# Patient Record
Sex: Male | Born: 1950 | Race: White | Hispanic: No | Marital: Married | Smoking: Never smoker
Health system: Southern US, Community
[De-identification: ages and names within clinical notes are randomized; demographics above are authoritative.]

## PROBLEM LIST (undated history)

## (undated) DIAGNOSIS — J309 Allergic rhinitis, unspecified: Secondary | ICD-10-CM

## (undated) DIAGNOSIS — K219 Gastro-esophageal reflux disease without esophagitis: Secondary | ICD-10-CM

## (undated) DIAGNOSIS — L988 Other specified disorders of the skin and subcutaneous tissue: Secondary | ICD-10-CM

## (undated) DIAGNOSIS — C01 Malignant neoplasm of base of tongue: Secondary | ICD-10-CM

## (undated) HISTORY — PX: OTHER SURGICAL HISTORY: SHX169

## (undated) HISTORY — PX: COLONOSCOPY: SHX174

## (undated) HISTORY — DX: Other specified disorders of the skin and subcutaneous tissue: L98.8

## (undated) HISTORY — PX: TONSILLECTOMY: SUR1361

---

## 2014-01-13 DIAGNOSIS — C01 Malignant neoplasm of base of tongue: Secondary | ICD-10-CM

## 2014-01-13 HISTORY — DX: Malignant neoplasm of base of tongue: C01

## 2014-01-13 HISTORY — PX: OTHER SURGICAL HISTORY: SHX169

## 2014-01-20 ENCOUNTER — Telehealth: Payer: Self-pay | Admitting: *Deleted

## 2014-01-20 NOTE — Telephone Encounter (Signed)
Called Mr. Butt to offer him attendance at next week [facility] [place].  I explained the purpose and format of the clinic, he agreed to attend.  I confirmed his understanding of [place] location, provided him an arrival time of 12:30, explained the registration procedure.  Initiating navigation as L1 patient (new patient) with this encounter.  Gayleen Orem, RN, BSN, [place] [facility] [place] [phone]

## 2014-01-23 ENCOUNTER — Ambulatory Visit (HOSPITAL_COMMUNITY)
Admission: RE | Admit: 2014-01-23 | Discharge: 2014-01-23 | Disposition: A | Discharge status: Home / Self Care | Payer: BC Managed Care – PPO | Source: Ambulatory Visit | Attending: Otolaryngology | Admitting: Otolaryngology

## 2014-01-23 ENCOUNTER — Encounter (HOSPITAL_COMMUNITY): Payer: Self-pay

## 2014-01-23 ENCOUNTER — Other Ambulatory Visit (HOSPITAL_COMMUNITY): Payer: Self-pay | Admitting: Otolaryngology

## 2014-01-23 DIAGNOSIS — IMO0002 Reserved for concepts with insufficient information to code with codable children: Secondary | ICD-10-CM

## 2014-01-23 DIAGNOSIS — C01 Malignant neoplasm of base of tongue: Secondary | ICD-10-CM | POA: Diagnosis not present

## 2014-01-23 LAB — POCT I-STAT, CHEM 8
BUN: 10 mg/dL (ref 6–23)
Calcium, Ion: 1.18 mmol/L (ref 1.13–1.30)
Chloride: 101 mEq/L (ref 96–112)
Creatinine, Ser: 1 mg/dL (ref 0.50–1.35)
GLUCOSE: 100 mg/dL — AB (ref 70–99)
HEMATOCRIT: 47 % (ref 39.0–52.0)
HEMOGLOBIN: 16 g/dL (ref 13.0–17.0)
Potassium: 3.9 mEq/L (ref 3.7–5.3)
Sodium: 139 mEq/L (ref 137–147)
TCO2: 27 mmol/L (ref 0–100)

## 2014-01-23 MED ORDER — IOHEXOL 300 MG/ML  SOLN
100.0000 mL | Freq: Once | INTRAMUSCULAR | Status: AC | PRN
  Administered 2014-01-23: 100 mL via INTRAVENOUS

## 2014-01-24 ENCOUNTER — Encounter: Payer: Self-pay | Admitting: Radiation Oncology

## 2014-01-24 NOTE — Progress Notes (Signed)
Head and Neck Cancer Location of Tumor / Histology:   Patient presented on 05/10/13 to Dr. Melissa Montane with c/o mild, intermittent sore throat x "6 months or longer". Examination revealed "hypertrophic lymph tissue in the base of his tongue and a piece of exudate that was in the vallecula."  He was given Prilosec for suspected Reflux.  On return visit on 06/10/13 he reported resolution of "90% of his symptoms, thus he was continued on Nexium BID for "a couple of months".  He was seen on 01/13/14 with with pain aain and fiberoptic exam revealed "an area of the left base of tongue that was different than the right side and had a "smooth cyst in the area".  CT Scan 01/23/14: Left tongue base mass as above, consistent with provided history of squamous cell carcinoma. No clearly enlarged cervical lymph nodes, however a 6 mm left level II lymph node is asymmetric and indeterminate.    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 Drinker, ? Drug use  Past/Anticipated interventions by otolaryngology, if any: Dr. Melissa Montane: Biopsy of Left Base of Tongue  Past/Anticipated interventions by medical oncology, if any: Dr. Heath Lark - 01/25/14  Referrals yet, to any of the following?  Social Work?   Dentistry? Dr. Teena Dunk - 01/27/15  Swallowing therapy? Serafina Royals - 01/25/14  Nutrition? Dory Peru - 01/25/14  Med/Onc? Dr. Heath Lark -01/25/14  PEG placement?  SAFETY ISSUES:  Prior radiation? No  Pacemaker/ICD?No  Possible current pregnancy? N/A  Is the patient on methotrexate? No  Current Complaints / other details:

## 2014-01-24 NOTE — Progress Notes (Signed)
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 Clinical Stage II.  HISTORY OF PRESENT ILLNESS::Enrrique H Hendricks is a 63 y.o. male who presented with left sided sore throat in January. This initially resolved but then returned. Dr. Janace Hoard appreciated a left base of tongue lesion. CT of neck, as reviewed at our tumor board this AM, shows an ulcerated soft tissue mass involving the left tongue base extending posteriorly and laterally to involve the left lateral oropharynx in the region of the tonsil and extending inferiorly into the left vallecula. The mass measures approximately 2.7 x 1.9 x 2.3 cm and may slightly cross the midline into the right aspect of the tongue base. The mass extends along the upper surface of the left epiglottis.  No enlarged lymph nodes, but there is a mildly asymmetric left level II lymph node which measures 6 mm in short axis.  Dr. Janace Hoard biopsied the L base of tongue mass on 01/13/14, revealing diffusely p16+ squamous cell carcinoma, per discussion in tumor board today.  He denies significant weight changes.  He has noted very minimal, slight voice changes. No prior tobacco use.  He reports drinking 1 beer and 1 wine daily. 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 He is here with his wife who appears very supportive.  He had a pre-melanoma lesion excised from his right neck in the past.  PREVIOUS RADIATION THERAPY: No  PAST MEDICAL HISTORY:  has a past medical history of Primary cancer of base of tongue (01/13/14); Allergic rhinitis; Esophageal reflux; and Dysplastic skin lesion.    PAST SURGICAL HISTORY: Past Surgical History  Procedure Laterality Date  . Biopsy of  base of tongue Left  01/13/14  . Oral surgery tooth extraction    . Tonsillectomy    . Colonoscopy      FAMILY HISTORY: family history includes Arthritis/Rheumatoid in his mother; Deafness in an other family member; Dementia in his mother; Osteoporosis in an other family member; Prostate cancer in his brother.  SOCIAL HISTORY:  reports that he has never smoked. He has never used smokeless tobacco. He reports that he drinks about 8.4 ounces of alcohol per week. He reports that he does not use illicit drugs.  ALLERGIES: Sulfa antibiotics  MEDICATIONS:  Current Outpatient Prescriptions  Medication Sig Dispense Refill  . cholecalciferol (VITAMIN D) 1000 UNITS tablet Take 1,000 Units by mouth daily.      Marland Kitchen esomeprazole (NEXIUM) 40 MG capsule Take 40 mg by mouth 2 (two) times daily before a meal.      . ibuprofen (ADVIL,MOTRIN) 200 MG tablet Take 200 mg by mouth as needed.      . Multiple Vitamin (MULTIVITAMIN) capsule Take 1 capsule by mouth daily.      . Multiple Vitamins-Minerals (OCUVITE EXTRA) TABS Take 1 tablet by mouth daily.      . naproxen sodium (ANAPROX) 220 MG tablet Take 220 mg by mouth as needed.      . fluticasone (FLONASE) 50 MCG/ACT nasal spray 1 spray by Each Nare route daily.       No current facility-administered medications for this encounter.    REVIEW OF SYSTEMS:  Notable for that above.   PHYSICAL EXAM:  height is '6\' 1"'  (1.854 m)  and weight is 171 lb 3.2 oz (77.656 kg). His temperature is 98.2 F (36.8 C). His blood pressure is 122/67 and his pulse is 64.   General: Alert and oriented, in no acute distress HEENT: Head is normocephalic. Pupils are equally round and reactive to light. Extraocular movements are intact. Oropharynx is clear on external exam. Metal work over molars. Neck: Neck is supple, no palpable cervical or supraclavicular lymphadenopathy. Subtle scar along right neck where pre-melanoma had been excised. Heart: Regular in rate and rhythm with no murmurs, rubs, or  gallops. Chest: Clear to auscultation bilaterally, with no rhonchi, wheezes, or rales. Abdomen: Soft, nontender, nondistended, with no rigidity or guarding. Extremities: No cyanosis or edema. Lymphatics: see HEENT/neck Skin: No concerning lesions. Musculoskeletal: symmetric strength and muscle tone throughout. Neurologic: Cranial nerves II through XII are grossly intact. No obvious focalities. Speech is fluent. Coordination is intact. Psychiatric: Judgment and insight are intact. Affect is appropriate.   ECOG = 0  0 - Asymptomatic (Fully active, able to carry on all predisease activities without restriction)  1 - Symptomatic but completely ambulatory (Restricted in physically strenuous activity but ambulatory and able to carry out work of a light or sedentary nature. For example, light housework, office work)  2 - Symptomatic, <50% in bed during the day (Ambulatory and capable of all self care but unable to carry out any work activities. Up and about more than 50% of waking hours)  3 - Symptomatic, >50% in bed, but not bedbound (Capable of only limited self-care, confined to bed or chair 50% or more of waking hours)  4 - Bedbound (Completely disabled. Cannot carry on any self-care. Totally confined to bed or chair)  5 - Death   Eustace Pen MM, Creech RH, Tormey DC, et al. [phone]). "Toxicity and response criteria of the [facility]". [place] Oncol. 5 (6): 649-55   LABORATORY DATA:  Lab Results  Component Value Date   HGB 16.0 01/23/2014   HCT 47.0 01/23/2014   CMP     Component Value Date/Time   NA 139 01/23/2014 1543   K 3.9 01/23/2014 1543   CL 101 01/23/2014 1543   GLUCOSE 100* 01/23/2014 1543   BUN 10 01/23/2014 1543   CREATININE 1.00 01/23/2014 1543      No results found for this basename: TSH      RADIOGRAPHY: Ct Soft Tissue Neck W Contrast  01/23/2014   CLINICAL DATA:  Squamous cell carcinoma. Recent diagnosis of tongue cancer.  EXAM: CT NECK WITH  CONTRAST  TECHNIQUE: Multidetector CT imaging of the neck was performed using the standard protocol following the bolus administration of intravenous contrast.  CONTRAST:  160m OMNIPAQUE IOHEXOL 300 MG/ML  SOLN  COMPARISON:  None.  FINDINGS: The visualized portions of the brain and orbits are unremarkable. Trace left maxillary sinus mucosal thickening is noted. Mastoid air cells are clear.  Nasopharynx is unremarkable. There is an ulcerated soft tissue mass involving the left tongue base extending posteriorly and laterally to involve the left lateral oropharynx in the region of the tonsil and extending inferiorly into the left vallecula. The mass measures approximately 2.7 x 1.9 x 2.3 cm and may slightly cross the midline into the right aspect of the tongue base. The mass extends along the upper surface of the left epiglottis.  No enlarged lymph nodes are identified in the neck by CT criteria, however there is a mildly asymmetric left level II lymph node which measures 6 mm in short axis (  series 2, image 66). Thyroid, submandibular, and parotid glands are unremarkable. Major vascular structures of the neck appear patent. Minimal atherosclerotic calcification is noted at the carotid bifurcations. Visualized lung apices are clear. Moderate cervical disc degeneration is noted at C5-6 and C6-7, and there is moderate left facet arthrosis at C3-4.  IMPRESSION: Left tongue base mass as above, consistent with provided history of squamous cell carcinoma. No clearly enlarged cervical lymph nodes, however a 6 mm left level II lymph node is asymmetric and indeterminate.   Electronically Signed   By: Logan Bores   On: 01/23/2014 18:08      IMPRESSION/PLAN:  This is a delightful 63 year old man with stage T2NxMx squamous cell carcinoma of the Left Base of Tongue; PET is pending, HPV status diffusely positive, negative smoking history. The patient is an excellent candidate for radiotherapy. The patient has been discussed in  detail at tumor board and seen in the context of multidisciplinary clinic today. Plan is as below:   1) The patient has met with med/onc to discuss chemotherapy - final recommendations pending PET results. He may not need chemotherapy.  If disease is even T2N1, systemic chemotherapy is only a 2B recommendation per NCCN guidelines.   1a) PET pending for 01-30-14.   2) Referral has been made to dentistry for dental evaluation/extractions in preparation for radiation in the vicinity of the mouth. Scatter guards will be needed as well.  3) Today in multidisciplinary clinic the patient will see Polo Riley from social work for social support  4) Today in multidisciplinary clinic he will see nutrition for nutrition support - only anticipate PEG tube if ChRT is pursued.   5) Medical Oncology will eventually refer to surgery for PEG tube placement only if chemotherapy is prescribed.  6) Will refer to swallowing therapy for evaluation and prophylactic treatment as needed for dysphagia, which can worsen during or after chemoradiotherapy.   7) PT consultation today in [facility] clinic for pre-RT assessment / neck measurements due to risk of lymphedema in neck; may benefit from PT for this after completion of radiotherapy. The patient also may benefit from this for potentional deconditioning after treatment    8) Simulation once cleared by dentistry. Anticipate 7 weeks of RT - 70 Gy in 35 fractions.   9) He may get a second opinion at [facility]. I encouraged him to do so, for peace of mind, and self-education.  He knows we will support whatever decision he makes in terms of where to receive care. He is heavily leaning towards keeping his care close to home.  10) Order Baseline TSH.   It was a pleasure meeting the patient today. We discussed the risks, benefits, and side effects of radiotherapy.   We talked in detail about acute and late effects. He understands that some of the most bothersome acute  effects may be significant soreness of the mouth and throat, changes in taste, changes in salivary function, skin irritation, hair loss, dehydration, weight loss and fatigue. We talked about late effects which include but are not necessarily limited to dysphagia, hypothyroidism, dry mouth, trismus, neck edema and nerve or spinal cord injury. No guarantees of treatment were given. A consent form was signed and placed in the patient's medical record. The patient is enthusiastic about proceeding with treatment. I look forward to participating in Mr. Depascale care.   __________________________________________   Eppie Gibson, MD

## 2014-01-25 ENCOUNTER — Ambulatory Visit
Admission: RE | Admit: 2014-01-25 | Discharge: 2014-01-25 | Disposition: A | Discharge status: Home / Self Care | Payer: BC Managed Care – PPO | Source: Ambulatory Visit | Attending: Radiation Oncology | Admitting: Radiation Oncology

## 2014-01-25 ENCOUNTER — Ambulatory Visit: Payer: BC Managed Care – PPO | Attending: Physical Therapy | Admitting: Physical Therapy

## 2014-01-25 ENCOUNTER — Encounter: Payer: Self-pay | Admitting: *Deleted

## 2014-01-25 ENCOUNTER — Encounter: Payer: Self-pay | Admitting: Hematology and Oncology

## 2014-01-25 ENCOUNTER — Ambulatory Visit (HOSPITAL_BASED_OUTPATIENT_CLINIC_OR_DEPARTMENT_OTHER): Payer: BC Managed Care – PPO | Admitting: Hematology and Oncology

## 2014-01-25 ENCOUNTER — Ambulatory Visit: Payer: BC Managed Care – PPO | Admitting: Nutrition

## 2014-01-25 ENCOUNTER — Other Ambulatory Visit: Payer: Self-pay | Admitting: Hematology and Oncology

## 2014-01-25 ENCOUNTER — Encounter: Payer: Self-pay | Admitting: Radiation Oncology

## 2014-01-25 VITALS — BP 122/67 | HR 64 | Temp 98.2°F | Ht 73.0 in | Wt 171.2 lb

## 2014-01-25 DIAGNOSIS — M2560 Stiffness of unspecified joint, not elsewhere classified: Secondary | ICD-10-CM | POA: Diagnosis not present

## 2014-01-25 DIAGNOSIS — C01 Malignant neoplasm of base of tongue: Secondary | ICD-10-CM | POA: Insufficient documentation

## 2014-01-25 DIAGNOSIS — IMO0001 Reserved for inherently not codable concepts without codable children: Secondary | ICD-10-CM | POA: Diagnosis not present

## 2014-01-25 HISTORY — DX: Gastro-esophageal reflux disease without esophagitis: K21.9

## 2014-01-25 HISTORY — DX: Allergic rhinitis, unspecified: J30.9

## 2014-01-25 HISTORY — DX: Malignant neoplasm of base of tongue: C01

## 2014-01-25 NOTE — Assessment & Plan Note (Signed)
Stage of the disease is to be determined, a PET/CT scan has been ordered.   Prognosis would depend on the results for the PET/CT scan, to be discussed and reviewed in the next visit. Treatment options would include chemotherapy only, radiation only or chemotherapy in combination with radiation therapy. If the patient had clinical T2, N1, M0 disease, it is not a strong indication to give concurrent chemotherapy along with radiation treatment.  I discussed with him and his wife the risks, benefits, side effects of high-dose cisplatin in this situation.  I will contact the patient for followup return appointment next week after the results of PET/CT scan becomes available.

## 2014-01-25 NOTE — Progress Notes (Signed)
Patient is seen in head and neck clinic.  63 year old male diagnosed with tongue cancer.  He is a patient of Dr. Isidore Moos and Dr. Alvy Bimler.  Past medical history includes esophageal reflux.  Medications include Nexium, vitamin D, and multivitamin.  Labs include glucose 100.  Height: 6 feet 1 inches. Weight: 171.2 pounds. Usual body weight: 175 pounds. BMI: 22.59.  Dietary recall reveals patient does consume healthy diet consisting of cereal, orange juice, yogurt, decaffeinated green tea at breakfast, juice smoothies and nutrition bar at lunch, and traditional dinner of protein, carbohydrate, and vegetables.  Patient consumes soy milk daily and soy protein in smoothies.  Currently patient has no nutrition side effects limiting his ability to eat.  Weight is generally stable and within normal BMI.  Nutrition diagnosis: Predicted suboptimal energy intake related to new diagnosis of tongue cancer and associated treatments as evidenced by the history or presence of a condition for which research shows an increased incidence of suboptimal energy intake.  Intervention: Patient and wife educated on strategies for eating during treatment. Patient encouraged to consume 6 small meals or snacks daily with protein at every snack/meal. Provided fact sheet on increasing calories and protein. Patient educated on protein sources in diet. Patient encouraged to discuss vitamin supplement usage with physician.  Monitoring, evaluation, goals: Patient will tolerate adequate calories and protein to minimize weight loss throughout treatment.  Next visit: To be scheduled once treatment plan determined.   **Disclaimer: This note was dictated with voice recognition software. Similar sounding words can inadvertently be transcribed and this note may contain transcription errors which may not have been corrected upon publication of note.**

## 2014-01-25 NOTE — Progress Notes (Signed)
[facility] [facility] CONSULT NOTE  Patient Care Team: Irven Shelling, MD as PCP - General (Internal Medicine) Eppie Gibson, MD as Attending Physician (Radiation Oncology) Brooks Sailors, RN as Oncology Nurse Navigator  CHIEF COMPLAINTS/PURPOSE OF CONSULTATION:  Squamous cell carcinoma of the base of the tongue  HISTORY OF PRESENTING ILLNESS:  Thomas Hendricks 62 y.o. male is here because of newly diagnosed squamous cell carcinoma, HPV positive According to the patient, the first initial presentation was due to sensation of sore throat since the summer of 2014. He mentioned this complaint to his primary care provider or from September of 2014. By December 2014, his symptoms were not better and he was subsequently referred to ENT for further evaluation. He underwent laryngoscopy which was benign. Initially, it was thought that his symptoms was related to reflux disease and he was prescribed Nexium twice a day with resolution of some of his symptoms a month later. Around August of this year, he has persistent sensation of sore throat and return to have ENT followup and underwent a laryngoscopy and biopsy of the base of the tongue which confirmed the diagnosis of squamous cell carcinoma, HPV positive.  he denies any hearing deficit, difficulties with chewing food, swallowing difficulties, painful swallowing, changes in the quality of his voice or abnormal weight loss. He rated his pain is 2/10 pain. He might have lost a few pounds of weight due to recent diagnosis and anxiety. Summary of his oncologic history is as follows:   Malignant neoplasm of base of tongue   01/13/2014 Pathology Results He had laryngoscopy and tongue biopsy that confirmed Squamous cell cancer   01/23/2014 Imaging Ct neck showed left tongue base mass, consistent with provided history of squamous cell carcinoma. No clearly enlarged cervical lymph nodes, however a 6 mm left level II lymph node is asymmetric      MEDICAL HISTORY:  Past Medical History  Diagnosis Date  . Primary cancer of base of tongue 01/13/14  . Allergic rhinitis   . Esophageal reflux   . Dysplastic skin lesion     SURGICAL HISTORY: Past Surgical History  Procedure Laterality Date  . Biopsy of  base of tongue Left 01/13/14  . Oral surgery tooth extraction    . Tonsillectomy    . Colonoscopy      SOCIAL HISTORY: History   Social History  . Marital Status: Married    Spouse Name: N/A    Number of Children: N/A  . Years of Education: N/A   Occupational History  . Not on file.   Social History Main Topics  . Smoking status: Never Smoker   . Smokeless tobacco: Never Used  . Alcohol Use: 8.4 oz/week    7 Glasses of wine, 7 Cans of beer per week  . Drug Use: No  . Sexual Activity: Not on file   Other Topics Concern  . Not on file   Social History Narrative  . No narrative on file    FAMILY HISTORY: Family History  Problem Relation Age of Onset  . Prostate cancer Brother   . Deafness    . Osteoporosis    . Arthritis/Rheumatoid      ALLERGIES:  is allergic to sulfa antibiotics.  MEDICATIONS:  Current Outpatient Prescriptions  Medication Sig Dispense Refill  . cholecalciferol (VITAMIN D) 1000 UNITS tablet Take 1,000 Units by mouth daily.      Marland Kitchen esomeprazole (NEXIUM) 40 MG capsule Take 40 mg by mouth 2 (two) times daily before  a meal.      . fluticasone (FLONASE) 50 MCG/ACT nasal spray 1 spray by Each Nare route daily.      Marland Kitchen ibuprofen (ADVIL,MOTRIN) 200 MG tablet Take 200 mg by mouth as needed.      . Multiple Vitamin (MULTIVITAMIN) capsule Take 1 capsule by mouth daily.      . Multiple Vitamins-Minerals (OCUVITE EXTRA) TABS Take 1 tablet by mouth daily.      . naproxen sodium (ANAPROX) 220 MG tablet Take 220 mg by mouth as needed.       No current facility-administered medications for this visit.    REVIEW OF SYSTEMS:   Constitutional: Denies fevers, chills or abnormal night sweats Eyes:  Denies blurriness of vision, double vision or watery eyes Respiratory: Denies cough, dyspnea or wheezes Cardiovascular: Denies palpitation, chest discomfort or lower extremity swelling Gastrointestinal:  Denies nausea, heartburn or change in bowel habits Skin: Denies abnormal skin rashes Lymphatics: Denies new lymphadenopathy or easy bruising Neurological:Denies numbness, tingling or new weaknesses Behavioral/Psych: Mood is stable, no new changes  All other systems were reviewed with the patient and are negative.  PHYSICAL EXAMINATION: ECOG PERFORMANCE STATUS: 0 - Asymptomatic GENERAL:alert, no distress and comfortable SKIN: skin color, texture, turgor are normal, no rashes or significant lesions EYES: normal, conjunctiva are pink and non-injected, sclera clear OROPHARYNX:no exudate, no erythema and lips, buccal mucosa, and tongue normal  NECK: supple, thyroid normal size, non-tender, without nodularity LYMPH:  no palpable lymphadenopathy in the cervical, axillary or inguinal LUNGS: clear to auscultation and percussion with normal breathing effort HEART: regular rate & rhythm and no murmurs and no lower extremity edema ABDOMEN:abdomen soft, non-tender and normal bowel sounds Musculoskeletal:no cyanosis of digits and no clubbing  PSYCH: alert & oriented x 3 with fluent speech NEURO: no focal motor/sensory deficits  LABORATORY DATA:  I have reviewed the data as listed Lab Results  Component Value Date   HGB 16.0 01/23/2014   HCT 47.0 01/23/2014   Lab Results  Component Value Date   NA 139 01/23/2014   K 3.9 01/23/2014   CL 101 01/23/2014    RADIOGRAPHIC STUDIES: I have personally reviewed the radiological images as listed and agreed with the findings in the report. Ct Soft Tissue Neck W Contrast  01/23/2014   CLINICAL DATA:  Squamous cell carcinoma. Recent diagnosis of tongue cancer.  EXAM: CT NECK WITH CONTRAST  TECHNIQUE: Multidetector CT imaging of the neck was performed using  the standard protocol following the bolus administration of intravenous contrast.  CONTRAST:  127mL OMNIPAQUE IOHEXOL 300 MG/ML  SOLN  COMPARISON:  None.  FINDINGS: The visualized portions of the brain and orbits are unremarkable. Trace left maxillary sinus mucosal thickening is noted. Mastoid air cells are clear.  Nasopharynx is unremarkable. There is an ulcerated soft tissue mass involving the left tongue base extending posteriorly and laterally to involve the left lateral oropharynx in the region of the tonsil and extending inferiorly into the left vallecula. The mass measures approximately 2.7 x 1.9 x 2.3 cm and may slightly cross the midline into the right aspect of the tongue base. The mass extends along the upper surface of the left epiglottis.  No enlarged lymph nodes are identified in the neck by CT criteria, however there is a mildly asymmetric left level II lymph node which measures 6 mm in short axis (series 2, image 66). Thyroid, submandibular, and parotid glands are unremarkable. Major vascular structures of the neck appear patent. Minimal atherosclerotic calcification is noted  at the carotid bifurcations. Visualized lung apices are clear. Moderate cervical disc degeneration is noted at C5-6 and C6-7, and there is moderate left facet arthrosis at C3-4.  IMPRESSION: Left tongue base mass as above, consistent with provided history of squamous cell carcinoma. No clearly enlarged cervical lymph nodes, however a 6 mm left level II lymph node is asymmetric and indeterminate.   Electronically Signed   By: Logan Bores   On: 01/23/2014 18:08    ASSESSMENT:  Newly diagnosed squamous cell carcinoma of the Head & Neck, HPV Positive  PLAN:  Malignant neoplasm of base of tongue Stage of the disease is to be determined, a PET/CT scan has been ordered.   Prognosis would depend on the results for the PET/CT scan, to be discussed and reviewed in the next visit. Treatment options would include chemotherapy  only, radiation only or chemotherapy in combination with radiation therapy. If the patient had clinical T2, N1, M0 disease, it is not a strong indication to give concurrent chemotherapy along with radiation treatment.  I discussed with him and his wife the risks, benefits, side effects of high-dose cisplatin in this situation.  I will contact the patient for followup return appointment next week after the results of PET/CT scan becomes available.     All questions were answered. The patient knows to call the clinic with any problems, questions or concerns. I spent 40 minutes counseling the patient face to face. The total time spent in the appointment was 60 minutes and more than 50% was on counseling.     [facility], [place], MD 01/25/2014 8:49 PM

## 2014-01-26 ENCOUNTER — Encounter: Payer: Self-pay | Admitting: Radiation Oncology

## 2014-01-26 ENCOUNTER — Other Ambulatory Visit (HOSPITAL_COMMUNITY): Payer: BC Managed Care – PPO | Admitting: Dentistry

## 2014-01-26 ENCOUNTER — Encounter (HOSPITAL_COMMUNITY): Payer: Self-pay | Admitting: Dentistry

## 2014-01-26 ENCOUNTER — Ambulatory Visit (HOSPITAL_COMMUNITY): Payer: Self-pay | Admitting: Dentistry

## 2014-01-26 VITALS — BP 117/64 | HR 59 | Temp 98.0°F

## 2014-01-26 DIAGNOSIS — C01 Malignant neoplasm of base of tongue: Secondary | ICD-10-CM

## 2014-01-26 DIAGNOSIS — IMO0002 Reserved for concepts with insufficient information to code with codable children: Secondary | ICD-10-CM

## 2014-01-26 DIAGNOSIS — K006 Disturbances in tooth eruption: Secondary | ICD-10-CM

## 2014-01-26 DIAGNOSIS — K0530 Chronic periodontitis, unspecified: Secondary | ICD-10-CM

## 2014-01-26 DIAGNOSIS — Z0189 Encounter for other specified special examinations: Secondary | ICD-10-CM

## 2014-01-26 DIAGNOSIS — M270 Developmental disorders of jaws: Secondary | ICD-10-CM

## 2014-01-26 DIAGNOSIS — K08409 Partial loss of teeth, unspecified cause, unspecified class: Secondary | ICD-10-CM

## 2014-01-26 DIAGNOSIS — M278 Other specified diseases of jaws: Secondary | ICD-10-CM

## 2014-01-26 DIAGNOSIS — K036 Deposits [accretions] on teeth: Secondary | ICD-10-CM

## 2014-01-26 DIAGNOSIS — K011 Impacted teeth: Secondary | ICD-10-CM

## 2014-01-26 NOTE — Progress Notes (Signed)
DENTAL CONSULTATION  Date of Consultation:  01/26/2014 Patient Name:   Thomas Hendricks Date of Birth:   January 21, 1951 Medical Record Number: [id number]  VITALS: BP 117/64  Pulse 59  Temp(Src) 98 F (36.7 C) (Oral)  CHIEF COMPLAINT: The patient was referred by Dr. Isidore Moos for a pre-chemoradiation therapy dental protocol examination.  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 Patient with anticipated radiation therapy and possible chemotherapy. Patient now seen as part of a medically necessary pre-chemoradiation therapy dental protocol examination.  The patient currently denies acute toothache, swellings, or abscesses. Patient is seen every 4 months for periodontal therapy with his primary dentist.  Patient sees Dr. Augustina Mood for his dental care.  Patient recently had several gold crown molar restorations placed. The patient also has recently completed orthodontic therapy with Dr. Mervin Kung in late 2014.  The patient is currently wearing retainers at bedtime. The patient denies having any unmet dental needs at this time.   PROBLEM LIST: Patient Active Problem List   Diagnosis Date Noted  . Malignant neoplasm of base of tongue 01/25/2014    PMH: Past Medical History  Diagnosis Date  . 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 Allergic rhinitis   . Esophageal reflux   . Dysplastic skin lesion     PSH: Past Surgical History  Procedure Laterality Date  . Biopsy of  base of tongue Left 01/13/14  . Oral surgery tooth extraction    . Tonsillectomy    . Colonoscopy      ALLERGIES: Allergies  Allergen Reactions  . Sulfa Antibiotics     As a child    MEDICATIONS: Current Outpatient Prescriptions  Medication Sig Dispense Refill  . cholecalciferol (VITAMIN D) 1000 UNITS tablet Take 1,000 Units by mouth daily.      Marland Kitchen esomeprazole (NEXIUM) 40 MG capsule Take 40 mg by mouth 2 (two)  times daily before a meal.      . ibuprofen (ADVIL,MOTRIN) 200 MG tablet Take 200 mg by mouth as needed.      . Multiple Vitamin (MULTIVITAMIN) capsule Take 1 capsule by mouth daily.      . Multiple Vitamins-Minerals (OCUVITE EXTRA) TABS Take 1 tablet by mouth daily.      . naproxen sodium (ANAPROX) 220 MG tablet Take 220 mg by mouth as needed.      . fluticasone (FLONASE) 50 MCG/ACT nasal spray 1 spray by Each Nare route daily.       No current facility-administered medications for this visit.    LABS: Lab Results  Component Value Date   HGB 16.0 01/23/2014   HCT 47.0 01/23/2014      Component Value Date/Time   NA 139 01/23/2014 1543   K 3.9 01/23/2014 1543   CL 101 01/23/2014 1543   GLUCOSE 100* 01/23/2014 1543   BUN 10 01/23/2014 1543   CREATININE 1.00 01/23/2014 1543   No results found for this basename: INR, PROTIME   No results found for this basename: PTT    SOCIAL HISTORY: History   Social History  . Marital Status: Married    Spouse Name: N/A    Number of Children: 2  . Years of Education: N/A   Occupational History  . FULL TIME ATTORNEY    Social History Main Topics  . Smoking status: Never Smoker   . Smokeless tobacco: Never Used  . Alcohol Use:  8.4 oz/week    7 Glasses of wine, 7 Cans of beer per week  . Drug Use: No  . Sexual Activity: Not on file   Other Topics Concern  . Not on file   Social History Narrative   The patient is married. Patient has 2 daughters.   The patient has never smoked. Patient has never used smokeless tobacco.   Patient does drink wine beer on a weekly basis.   The patient is an Forensic psychologist at [employer].    FAMILY HISTORY: Family History  Problem Relation Age of Onset  . Prostate cancer Brother   . Deafness    . Osteoporosis    . Arthritis/Rheumatoid       REVIEW OF SYSTEMS:  Constitutional: Denies fevers, chills or abnormal night sweats  Eyes: Denies blurriness of vision, double vision or watery eyes  Respiratory: Denies  cough, dyspnea or wheezes  Cardiovascular: Denies palpitation, chest discomfort or lower extremity swelling  Gastrointestinal: Denies nausea, heartburn or change in bowel habits  Skin: Denies abnormal skin rashes  Lymphatics: Denies new lymphadenopathy or easy bruising  Neurological:Denies numbness, tingling or new weaknesses  Behavioral/Psych: Mood is stable, no new changes  All other systems were reviewed with the patient and are negative.  DENTAL HISTORY: CHIEF COMPLAINT: The patient was referred by Dr. Isidore Moos for a pre-chemoradiation therapy dental protocol examination.  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 Patient with anticipated radiation therapy and possible chemotherapy. Patient now seen as part of a medically necessary pre-chemoradiation therapy dental protocol examination.  The patient currently denies acute toothache, swellings, or abscesses. Patient is seen every 4 months for periodontal therapy with his primary dentist.  Patient sees Dr. Augustina Mood for his dental care.  Patient recently had several gold crown molar restorations placed. The patient also has recently completed orthodontic therapy with Dr. Mervin Kung in late 2014.  The patient is currently wearing retainers at bedtime. The patient denies having any unmet dental needs at this time.   DENTAL EXAMINATION: GENERAL: The patient is a well-developed, well-nourished male in no acute distress. HEAD AND NECK: There is no palpable submandibular lymphadenopathy. Patient does have possible left neck lymphadenopathy per CT scan.  PET scan is pending 4 01/30/2014. The patient denies having any acute TMJ symptoms. INTRAORAL EXAM: Patient has normal saliva. The patient has extensive bilateral, multilobular mandibular lingual tori and maxillary and mandibular posterior lateral exostoses. DENTITION: The patient is missing tooth numbers 1, 4, 13, 16, 21,  and 28. Premolar teeth were removed for orthodontic reasons by patient report during his first episode of orthodontics in Middle school. Patient has full bony impacted tooth numbers 17 and 32 remaining. PERIODONTAL: The patient has chronic periodontitis with minimal plaque accumulations, gingival recession, and incipient mandibular anterior tooth mobility of tooth numbers 24, 25, 26. Patient may have incipient buccal furcation involvement of tooth #30. DENTAL CARIES/SUBOPTIMAL RESTORATIONS: There are no obvious dental caries noted. " I have not had a cavity for over 50 years". ENDODONTIC: Patient denies having any acute pulpitis symptoms. I do not see any evidence of periapical pathology or radiolucency. CROWN AND BRIDGE: Patient has full gold crowns on tooth numbers 18, 19, 30, and 31. PROSTHODONTIC: There are no partial dentures. OCCLUSION: The patient has a stable occlusion. Patient has completed orthodontic therapy while in middle school and more recently orthodontic therapy of 5 years completed by Dr. Joannie Springs at the end of  2014. Patient is currently wearing retainers at bedtime.   RADIOGRAPHIC INTERPRETATION: An orthopantogram was obtained and supplemented with panoramic bitewings secondary to extensive mandibular tori. Maxillary periapical radiographs were obtained as well. There are multiple missing teeth. There are full bony impacted tooth numbers 17 and 32. Multiple premolars were removed for orthodontic reasons. Multiple diastemas are noted but spaces are generally closed after orthodontic therapy. There is incipient to moderate bone loss. There is no evidence of periapical pathology or radiolucency. No obvious dental caries are noted. Multiple crown and resin restorations are noted. Radio opacities are noted around teeth consistent with bilateral mandibular lingual tori and lateral exostoses.   ASSESSMENTS: 1. Squamous cell carcinoma of the left base of tongue. 2. Preradiation therapy  and chemotherapy dental protocol examination 3. Bilateral extensive multilobular mandibular lingual tori 4. Maxillary and mandubular posterior lateral exostoses 5. Chronic periodontitis with bone loss 6. Generalized gingival recession 7. Mandibular anterior incipient tooth mobility 8. Multiple missing teeth --multiple premolar teeth were extracted for orthodontic reasons 9. Full bony impacted tooth #17 and 32. 10. Status post orthodontic therapy with stable occlusion 11. Multiple diastemas-but most spaces closed after orthodontic therapy 12. No evidence of periapical pathology or radiolucency. 13. Possible furcation involvement of the mid-buccal of #30  PLAN/RECOMMENDATIONS: 1. I discussed the risks, benefits, and complications of various treatment options with the patient in relationship to his medical and dental conditions, anticipated radiation therapy and possible chemotherapy, and chemoradiation therapy side effects to include xerostomia, radiation caries, mucositis, taste changes, gum and jawbone changes, and risk for infection, bleeding, and osteoradionecrosis.  We discussed various treatment options to include no treatment, multiple extraction of teeth in the primary field of radiation therapy, alveoloplasty, bilateral mandibular lingual tori reductions and lateral exostoses pre-prosthetic surgery as indicated, periodontal therapy, dental restorations, root canal therapy, crown and bridge therapy, implant therapy, and replacement of missing teeth as indicated. We also discussed referral to an oral surgeon for second opinion concerning extraction of molar teeth in the primary field of radiation therapy with alveoloplasty and pre-prosthetic surgery. We also discussed fabrication of fluoride trays and scatter protection devices as indicated. The patient currently wishes to proceed with referral to Dr. Roney Jaffe for a second opinion consultation concerning extraction of teeth in the primary  field with alveoloplasty and pre-prosthetic surgery as indicated. The patient expressed concern with delay of chemoradiation therapy if dental extractions and pre-prosthetic surgery is performed prior to treatment plan and will again discuss this concern with Dr. Loyal Gambler at his consultation.This consultation has been scheduled for 01/31/2014 at 4 PM. Patient also agreed to proceed with impressions for fabrication of fluoride trays and scatter protection devices-today. Prescription for FluoriSHIELD was sent to [facility] pharmacy.  2. Discussion of findings with medical and dental team members and coordination of future medical and dental care as needed.  I spent 90 minutes face to face with patient and more than 50% of time was spent in counseling and /or coordination of care.   Lenn Cal, DDS

## 2014-01-26 NOTE — Progress Notes (Signed)
Met with patient and his wife during H&N MDC: 1. Re-introduced myself as their Navigator, explained my role as a member of the Care Team, provided contact information, encouraged them to contact me with questions/concerns as treatments/procedures begin. 2. Provided New Patient Information packet:  Contact information for physicians and navigator  Advance Directive information ([place] blue pamphlet)  Fall Prevention Patient Safety Plan  [facility] campus map with highlight of [place] 3. Provided introductory explanation of radiation treatment including SIM planning and fitting of head mask, showed them example of mask.   4. Provided a tour of SIM and Tomo areas, explained treatment and arrival procedures.   5. Escorted them to Dr. Ritta Slot office in [place] to facilitate his scheduling of dental consult. They verbalized understanding of information provided.    Gayleen Orem, RN, BSN, [place] at [place] [phone]

## 2014-01-26 NOTE — Patient Instructions (Signed)

## 2014-01-27 ENCOUNTER — Telehealth: Payer: Self-pay | Admitting: Hematology and Oncology

## 2014-01-27 ENCOUNTER — Telehealth: Payer: Self-pay | Admitting: *Deleted

## 2014-01-27 ENCOUNTER — Encounter: Payer: Self-pay | Admitting: *Deleted

## 2014-01-27 NOTE — Telephone Encounter (Signed)
Faxed pt medical records to [facility] °

## 2014-01-27 NOTE — Progress Notes (Deleted)
Met with patient and his wife during H&N MDC:   1. Further introduced myself as their Navigator, explained my role as a member of the Care Team, provided contact information, encouraged them to contact me with questions/concerns as treatments/procedures begin. 2. Provided New Patient Information packet:  Contact information for physician and navigator  Advance Directive information ([facility] blue pamphlet)  Fall Prevention Patient Safety Plan  [facility] map with highlight of [facility] 3. Provided introductory explanation of radiation treatment including SIM planning and fitting of head mask.    4. Provided a tour of SIM and Tomo areas, explained treatment and arrival procedures.   5. Escorted him to Dr. Kulinski's office to facilitate scheduling of appt. They verbalized understanding of information provided.    Rick Diehl, RN, BSN, CHPN Head & Neck Oncology Navigator [facility] at [place] [phone]   

## 2014-01-27 NOTE — Telephone Encounter (Signed)
Pt called for clarification of instructions for Monday's PET.  He informed that he has a consult at [facility] next Wednesday, 9/23; indicated he will call me afterwards.  Gayleen Orem, RN, BSN, [place] at [place] [phone]

## 2014-01-27 NOTE — Telephone Encounter (Signed)
Called patient to inform of lab on 01-30-14 @ 2 pm @ [facility], spoke with patient and he will have this done on 01-30-14.

## 2014-01-27 NOTE — Progress Notes (Signed)
Per Dr. Isidore Moos, I faxed [place] for patient to Dr. Roney Jaffe for his 01/31/14 consult.  Gayleen Orem, RN, BSN, [place] at [place] [phone]

## 2014-01-27 NOTE — Progress Notes (Signed)
Duplicate entry

## 2014-01-30 ENCOUNTER — Ambulatory Visit
Admission: RE | Admit: 2014-01-30 | Discharge: 2014-01-30 | Disposition: A | Discharge status: Home / Self Care | Payer: BC Managed Care – PPO | Source: Ambulatory Visit | Attending: Radiation Oncology | Admitting: Radiation Oncology

## 2014-01-30 ENCOUNTER — Other Ambulatory Visit: Payer: BC Managed Care – PPO | Admitting: Radiation Oncology

## 2014-01-30 ENCOUNTER — Encounter (HOSPITAL_COMMUNITY)
Admission: RE | Admit: 2014-01-30 | Discharge: 2014-01-30 | Disposition: A | Discharge status: Home / Self Care | Payer: BC Managed Care – PPO | Source: Ambulatory Visit | Attending: Otolaryngology | Admitting: Otolaryngology

## 2014-01-30 ENCOUNTER — Telehealth: Payer: Self-pay | Admitting: *Deleted

## 2014-01-30 DIAGNOSIS — C01 Malignant neoplasm of base of tongue: Secondary | ICD-10-CM

## 2014-01-30 DIAGNOSIS — C801 Malignant (primary) neoplasm, unspecified: Secondary | ICD-10-CM | POA: Insufficient documentation

## 2014-01-30 DIAGNOSIS — IMO0002 Reserved for concepts with insufficient information to code with codable children: Secondary | ICD-10-CM

## 2014-01-30 LAB — GLUCOSE, CAPILLARY: Glucose-Capillary: 100 mg/dL — ABNORMAL HIGH (ref 70–99)

## 2014-01-30 LAB — TSH CHCC: TSH: 2.34 m(IU)/L (ref 0.320–4.118)

## 2014-01-30 MED ORDER — FLUDEOXYGLUCOSE F - 18 (FDG) INJECTION
8.5000 | Freq: Once | INTRAVENOUS | Status: AC | PRN
  Administered 2014-01-30: 8.5 via INTRAVENOUS

## 2014-01-30 NOTE — Telephone Encounter (Signed)
Called patient to inform of MBSS on 02-07-14- arrival time - 12:45 pm @ [facility] and his visit with Garald Balding on 02-13-14 - arrival time - 2:30 pm, spoke with patient's wife and she is aware of these appts.

## 2014-01-31 ENCOUNTER — Telehealth: Payer: Self-pay | Admitting: *Deleted

## 2014-01-31 ENCOUNTER — Encounter: Payer: Self-pay | Admitting: Radiation Oncology

## 2014-01-31 DIAGNOSIS — C01 Malignant neoplasm of base of tongue: Secondary | ICD-10-CM

## 2014-01-31 NOTE — Progress Notes (Signed)
I discussed the PET results with Dr. Janace Hoard, Dr. Alvy Bimler, and Thomas Hendricks.  Plan is to get plain films of right hand to rule out metastatic disease (which would be rare in this area) and have patient call Dr. Janace Hoard' office to schedule repeat laryngoscopy to assess larynx.  Dr. Janace Hoard feels that his base of tongue tumor did not involve the epiglottis, but will again assess for this to rule out T3 staging, as this could affect chemotherapy recommendations. Thomas Hendricks is pleased with this plan, and glad to hear that neck nodes are grossly negative on PET.  Nm Pet Image Initial (pi) Skull Base To Thigh  01/30/2014   CLINICAL DATA:  Initial treatment strategy for squamous cell carcinoma of the head and neck.  EXAM: NUCLEAR MEDICINE PET SKULL BASE TO THIGH  TECHNIQUE: 8.5 mCi F-18 FDG was injected intravenously. Full-ring PET imaging was performed from the skull base to thigh after the radiotracer. CT data was obtained and used for attenuation correction and anatomic localization.  FASTING BLOOD GLUCOSE:  Value: 100 mg/dl  COMPARISON:  No prior PET-CT.  CT of the neck 01/23/2014.  FINDINGS: NECK  There is a supraglottic soft tissue mass best appreciated on image 35 of series 4 on the left side measuring 2.4 x 3.2 cm which is hypermetabolic (SUVmax = 02.7). There is also some relatively symmetric hypermetabolism (SUVmax = 13.4) along the posterior aspect of the larynx, without a definite soft tissue correlate, which is of uncertain etiology and significance, but may be physiologic.  CHEST  No hypermetabolic mediastinal or hilar nodes. No suspicious pulmonary nodules on the CT scan. Heart size is normal. There is no significant pericardial fluid, thickening or pericardial calcification. No consolidative airspace disease. No pleural effusions. Minimal dependent subsegmental atelectasis in the lung bases bilaterally.  ABDOMEN/PELVIS  No abnormal hypermetabolic activity within the liver, pancreas, adrenal glands, or  spleen. No hypermetabolic lymph nodes in the abdomen or pelvis. 1.9 cm low-attenuation lesion in the interpolar region of the right kidney demonstrates no internal hypermetabolism, and although incompletely characterized is likely a small cyst. Normal appendix. No significant volume of ascites. No pneumoperitoneum. No pathologic distention of small bowel. Numerous colonic diverticulae are noted, without surrounding inflammatory changes to suggest an acute diverticulitis at this time.  SKELETON  There is a linear appearing area of hypermetabolism associated with the right hand, likely within the region of the right fifth metacarpal (SUVmax = 4.9). No other focal hypermetabolic activity to suggest skeletal metastasis.  IMPRESSION: 1. Left-sided supraglottic soft tissue tumor measuring approximately 2.4 x 3.2 cm is hypermetabolic (SUVmax = 25.3), compatible with the reported primary squamous cell neoplasm. No cervical lymphadenopathy. 2. There is some relatively symmetric hypermetabolism along the posterior aspect of the larynx. No definite soft tissue mass in this region. This is favored to be physiologic, but direct inspection is recommended. 3. Linear area of hypermetabolism in the right hand, likely in the region of the right fifth metacarpal. Dedicated plain film evaluation of the right hand is suggested. This may be related to benign disease, such as a healing fracture, but a solitary osseous metastasis is difficult to exclude. 4. Additional incidental findings, as above.   Electronically Signed   By: Vinnie Langton M.D.   On: 01/30/2014 16:12   -----------------------------------  Thomas Gibson, MD

## 2014-01-31 NOTE — Telephone Encounter (Signed)
Called patient to ask about coming for an x-ray, lvm for a return call

## 2014-02-01 ENCOUNTER — Other Ambulatory Visit (HOSPITAL_COMMUNITY): Payer: Self-pay | Admitting: Radiation Oncology

## 2014-02-01 ENCOUNTER — Other Ambulatory Visit (HOSPITAL_COMMUNITY): Payer: Self-pay | Admitting: Dentistry

## 2014-02-01 ENCOUNTER — Inpatient Hospital Stay (HOSPITAL_COMMUNITY): Admission: RE | Admit: 2014-02-01 | Payer: Self-pay | Source: Ambulatory Visit

## 2014-02-01 ENCOUNTER — Encounter: Payer: Self-pay | Admitting: Radiation Oncology

## 2014-02-01 DIAGNOSIS — C801 Malignant (primary) neoplasm, unspecified: Secondary | ICD-10-CM

## 2014-02-01 MED ORDER — SODIUM FLUORIDE 1.1 % DT GEL: DENTAL | Status: DC

## 2014-02-01 NOTE — Progress Notes (Signed)
I spoke with Thomas Hendricks today. He had consultations at [facility] with radiation oncology and otolaryngology. I also spoke personally with Dr. Loletta Parish from otolaryngology at [facility]. My understanding is that Dr. 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 This deems him eligible for a "de-escalation trial" that would modify the dose of radiation and chemotherapy that Thomas Hendricks would receive. Thomas Hendricks finds this trial very appealing. He felt conflicted about whether he should go to [facility] for this trial, or pursue treatments here. He understands that if he were receive treatment here, systemic therapy (either CDDP or Erbitux) would be recommended given the opinion of [facility] that his is tumor stage is T3. (This is based on my discussion with Dr. Alvy Bimler today given the information above).  After a very lengthy discussion with Thomas Hendricks, he and I agree that he would have the best peace of mind if he pursued this trial. I support his decision and wished him the best. He will get in touch with Thomas Hendricks of dentistry re: plans from this point forward. He also asked me to cancel the x-ray of his hand as the specialists at [facility] told him it is not necessary; their suspicion for cancer in his hand is extremely low.  Thomas Hendricks expressed deep thanks for the attention and expertise but he  received here and wanted me to convey that to the rest of our team. I wished him the best. I will ask Thomas Orem, Thomas Hendricks, our Head and Neck Oncology Navigator to arrange for all tests and cancer center appointments to be canceled here.   -----------------------------------  Eppie Gibson, MD

## 2014-02-06 ENCOUNTER — Other Ambulatory Visit: Payer: Self-pay | Admitting: Radiation Oncology

## 2014-02-07 ENCOUNTER — Other Ambulatory Visit (HOSPITAL_COMMUNITY): Payer: Self-pay

## 2014-02-07 ENCOUNTER — Ambulatory Visit (HOSPITAL_COMMUNITY): Payer: BC Managed Care – PPO

## 2014-02-13 ENCOUNTER — Ambulatory Visit: Payer: BC Managed Care – PPO

## 2014-02-13 ENCOUNTER — Ambulatory Visit: Payer: Self-pay

## 2014-02-19 ENCOUNTER — Emergency Department (HOSPITAL_COMMUNITY)
Admission: EM | Admit: 2014-02-19 | Discharge: 2014-02-19 | Disposition: A | Discharge status: Home / Self Care | Payer: BC Managed Care – PPO | Attending: Emergency Medicine | Admitting: Emergency Medicine

## 2014-02-19 ENCOUNTER — Encounter (HOSPITAL_COMMUNITY): Payer: Self-pay | Admitting: Emergency Medicine

## 2014-02-19 DIAGNOSIS — K219 Gastro-esophageal reflux disease without esophagitis: Secondary | ICD-10-CM | POA: Diagnosis not present

## 2014-02-19 DIAGNOSIS — C01 Malignant neoplasm of base of tongue: Secondary | ICD-10-CM | POA: Insufficient documentation

## 2014-02-19 DIAGNOSIS — K5909 Other constipation: Secondary | ICD-10-CM | POA: Diagnosis not present

## 2014-02-19 DIAGNOSIS — K5900 Constipation, unspecified: Secondary | ICD-10-CM | POA: Diagnosis present

## 2014-02-19 DIAGNOSIS — Z79899 Other long term (current) drug therapy: Secondary | ICD-10-CM | POA: Insufficient documentation

## 2014-02-19 DIAGNOSIS — Z872 Personal history of diseases of the skin and subcutaneous tissue: Secondary | ICD-10-CM | POA: Insufficient documentation

## 2014-02-19 MED ORDER — MILK AND MOLASSES ENEMA
1.0000 | Freq: Once | RECTAL | Status: AC
  Administered 2014-02-19: 250 mL via RECTAL
  Filled 2014-02-19: qty 250

## 2014-02-19 MED ORDER — POLYETHYLENE GLYCOL 3350 17 GM/SCOOP PO POWD: 17.0000 g | Freq: Two times a day (BID) | ORAL | Status: DC

## 2014-02-19 NOTE — ED Notes (Signed)
Reports having return from enema about every 5 minutes. Wants nurse to return in 10 minutes and check back before cleaning.

## 2014-02-19 NOTE — ED Provider Notes (Signed)
CSN: [id number]     Arrival date & time 02/19/14  1005 History   First MD Initiated Contact with Patient 02/19/14 1032     Chief Complaint  Patient presents with  . Rectal Pain  . 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 He states he began treatment last week. Endorses some mild pressure in his lower abdomen. He has tried Miralax, Stool softeners, and attempted to disimpact himself with no improvement. Denies any nausea or vomiting. + Flatus. No abdominal surgical history. No fevers or chills.    Past Medical History  Diagnosis Date  . 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 Allergic rhinitis   . Esophageal reflux   . Dysplastic skin lesion    Past Surgical History  Procedure Laterality Date  . Biopsy of  base of tongue Left 01/13/14  . Oral surgery tooth extraction    . Tonsillectomy    . Colonoscopy     Family History  Problem Relation Age of Onset  . Prostate cancer Brother   . Deafness    . Osteoporosis    . Arthritis/Rheumatoid Mother   . Dementia Mother    History  Substance Use Topics  . Smoking status: Never Smoker   . Smokeless tobacco: Never Used  . Alcohol Use: 8.4 oz/week    7 Glasses of wine, 7 Cans of beer per week    Review of Systems  Gastrointestinal: Positive for constipation.  All other systems reviewed and are negative.     Allergies  Sulfa antibiotics  Home Medications   Prior to Admission medications   Medication Sig Start Date End Date Taking? Authorizing Provider  cholecalciferol (VITAMIN D) 1000 UNITS tablet Take 1,000 Units by mouth daily.   Yes Historical Provider, MD  esomeprazole (NEXIUM) 40 MG capsule Take 40 mg by mouth 2 (two) times daily before a meal.    Yes Historical Provider, MD  fluticasone (FLONASE) 50 MCG/ACT nasal spray Place 1 spray into both nostrils daily as needed for allergies. 1 spray by Each Nare route daily.   Yes Historical Provider, MD  ondansetron (ZOFRAN) 8 MG tablet Take 8 mg by mouth every 8 (eight) hours as needed for nausea or vomiting.   Yes Historical Provider, MD  prochlorperazine (COMPAZINE) 10 MG tablet Take 10 mg by mouth every 6 (six) hours as needed for nausea or vomiting.   Yes Historical Provider, MD  polyethylene glycol powder (GLYCOLAX/MIRALAX) powder Take 17 g by mouth 2 (two) times daily. Until daily soft stools  OTC 02/19/14   Adonay Scheier L Kimberli Winne, PA-C   BP 128/70  Pulse 76  Temp(Src) 98.2 F (36.8 C) (Oral)  Resp 18  Ht 6\' 1"  (1.854 m)  Wt 165 lb (74.844 kg)  BMI 21.77 kg/m2  SpO2 98% Physical Exam  Nursing note and vitals reviewed. Constitutional: He is oriented to person, place, and time. He appears well-developed and well-nourished. No distress.  HENT:  Head: Normocephalic and atraumatic.  Right Ear: External ear normal.  Left Ear: External ear normal.  Nose: Nose normal.  Mouth/Throat: Oropharynx is clear and moist.  Eyes: Conjunctivae are normal.  Neck: Normal range of motion. Neck supple.  Cardiovascular: Normal rate, regular rhythm and normal heart sounds.   Pulmonary/Chest: Effort normal and breath  sounds normal.  Abdominal: Soft. Bowel sounds are normal. He exhibits no distension. There is tenderness (mild lower abdomen discomfort). There is no rigidity, no rebound and no guarding.  Genitourinary: Rectal exam shows no mass.  Fecal impaction on DRE. Brown stool. No BRB.   Musculoskeletal: Normal range of motion.  Neurological: He is alert and oriented to person, place, and time.  Skin: Skin is warm and dry. He is not diaphoretic.  Psychiatric: He has a normal mood and affect.    ED Course  Fecal disimpaction Date/Time: 02/19/2014 10:57 AM Performed by: Harlow Mares Authorized by: Harlow Mares Consent: Verbal consent obtained. Risks and benefits: risks, benefits and alternatives were discussed Consent given by: patient Time out: Immediately prior to procedure a "time out" was called to verify the correct patient, procedure, equipment, support staff and site/side marked as required. Local anesthesia used: no Patient sedated: no Patient tolerance: Patient tolerated the procedure well with no immediate complications.   (including critical care time) Labs Review Labs Reviewed - No data to display  Imaging Review No results found.   EKG Interpretation None      1:37 PM Patient able to successfully evacuate his bowels.   MDM   Final diagnoses:  Other constipation    Filed Vitals:   02/19/14 1400  BP: 128/70  Pulse: 76  Temp:   Resp: 18   Afebrile, NAD, non-toxic appearing, AAOx4.  Abdomen soft, mildly tender in lower quadrants, non-distended. Normoactive bowel sounds. Fecal impaction on digital rectal examination. The patient was disimpacted. Stool was brown in color not melanic or with bright red blood. Milk and molasses enema given with successful evacuation of bowels. No stenosis or obstruction. Advised patient to followup with his oncologist. Advised patient to take one capsule twice a day of the Miralax and on his stools are soft along with his prescribed stool softeners. Return precautions discussed. Patient is agreeable to plan. Patient stable at time of discharge. Patient d/w with Dr. Betsey Holiday, agrees with plan.    Harlow Mares, PA-C 02/19/14 1637

## 2014-02-19 NOTE — ED Notes (Signed)
Pt. Stated, I have cancer and started chemotherapy and its caused a lot of constipation.  I have a fecal impaction and it needs to come out.

## 2014-02-19 NOTE — ED Notes (Signed)
Large amount of stool production. Notified PA, Jen.

## 2014-02-19 NOTE — ED Notes (Signed)
PA at bedside attempting disimpaction

## 2014-02-19 NOTE — ED Notes (Signed)
Tolerated enema well. Still holding majority of infusion at this time.

## 2014-02-19 NOTE — Discharge Instructions (Signed)
Please follow up with your primary care physician in 1-2 days. If you do not have one please call the [facility] number listed above. Please follow up with your oncologist to schedule a follow up appointment.  Please take the Miralax 1 cap full in the morning and at night until her stools are soft and he may try once a day. Please take her stool softener as prescribed by your oncologist. Please read all discharge instructions and return precautions.    Constipation Constipation is when a person has fewer than three bowel movements a week, has difficulty having a bowel movement, or has stools that are dry, hard, or larger than normal. As people grow older, constipation is more common. If you try to fix constipation with medicines that make you have a bowel movement (laxatives), the problem may get worse. Long-term laxative use may cause the muscles of the colon to become weak. A low-fiber diet, not taking in enough fluids, and taking certain medicines may make constipation worse.  CAUSES   Certain medicines, such as antidepressants, pain medicine, iron supplements, antacids, and water pills.   Certain diseases, such as diabetes, irritable bowel syndrome (IBS), thyroid disease, or depression.   Not drinking enough water.   Not eating enough fiber-rich foods.   Stress or travel.   Lack of physical activity or exercise.   Ignoring the urge to have a bowel movement.   Using laxatives too much.  SIGNS AND SYMPTOMS   Having fewer than three bowel movements a week.   Straining to have a bowel movement.   Having stools that are hard, dry, or larger than normal.   Feeling full or bloated.   Pain in the lower abdomen.   Not feeling relief after having a bowel movement.  DIAGNOSIS  Your health care provider will take a medical history and perform a physical exam. Further testing may be done for severe constipation. Some tests may include:  A barium enema  X-ray to examine your rectum, colon, and, sometimes, your small intestine.   A sigmoidoscopy to examine your lower colon.   A colonoscopy to examine your entire colon. TREATMENT  Treatment will depend on the severity of your constipation and what is causing it. Some dietary treatments include drinking more fluids and eating more fiber-rich foods. Lifestyle treatments may include regular exercise. If these diet and lifestyle recommendations do not help, your health care provider may recommend taking over-the-counter laxative medicines to help you have bowel movements. Prescription medicines may be prescribed if over-the-counter medicines do not work.  HOME CARE INSTRUCTIONS   Eat foods that have a lot of fiber, such as fruits, vegetables, whole grains, and beans.  Limit foods high in fat and processed sugars, such as french fries, hamburgers, cookies, candies, and soda.   A fiber supplement may be added to your diet if you cannot get enough fiber from foods.   Drink enough fluids to keep your urine clear or pale yellow.   Exercise regularly or as directed by your health care provider.   Go to the restroom when you have the urge to go. Do not hold it.   Only take over-the-counter or prescription medicines as directed by your health care provider. Do not take other medicines for constipation without talking to your health care provider first.  [place] IF:   You have bright red blood in your stool.   Your constipation lasts for more than 4 days or gets worse.  You have abdominal or rectal pain.   You have thin, pencil-like stools.   You have unexplained weight loss. MAKE SURE YOU:   Understand these instructions.  Will watch your condition.  Will get help right away if you are not doing well or get worse. Document Released: 01/25/2004 Document Revised: 05/03/2013 Document Reviewed: 02/07/2013 [facility] Patient Information 2015 [place], [place]. This  information is not intended to replace advice given to you by your health care provider. Make sure you discuss any questions you have with your health care provider.

## 2014-02-23 NOTE — ED Provider Notes (Signed)
Medical screening examination/treatment/procedure(s) were performed by non-physician practitioner and as supervising physician I was immediately available for consultation/collaboration.   EKG Interpretation None        Orpah Greek, MD 02/23/14 0730

## 2014-02-26 NOTE — Addendum Note (Signed)
Encounter addended by: Deirdre Evener, RN on: 02/26/2014  7:53 PM<BR>     Documentation filed: Arn Medal VN, Inpatient Patient Education

## 2014-02-27 ENCOUNTER — Telehealth: Payer: Self-pay | Admitting: *Deleted

## 2014-02-27 ENCOUNTER — Other Ambulatory Visit: Payer: Self-pay | Admitting: Radiation Oncology

## 2014-02-27 NOTE — Telephone Encounter (Signed)
Called patient to ask about coming in for hand x-ray, lvm for a return call

## 2014-06-14 DIAGNOSIS — C159 Malignant neoplasm of esophagus, unspecified: Secondary | ICD-10-CM | POA: Diagnosis not present

## 2014-06-14 DIAGNOSIS — Z6820 Body mass index (BMI) 20.0-20.9, adult: Secondary | ICD-10-CM | POA: Diagnosis not present

## 2014-06-14 DIAGNOSIS — C01 Malignant neoplasm of base of tongue: Secondary | ICD-10-CM | POA: Diagnosis not present

## 2014-06-14 DIAGNOSIS — Z8042 Family history of malignant neoplasm of prostate: Secondary | ICD-10-CM | POA: Diagnosis not present

## 2014-06-14 DIAGNOSIS — R972 Elevated prostate specific antigen [PSA]: Secondary | ICD-10-CM | POA: Diagnosis not present

## 2014-06-20 DIAGNOSIS — Z79899 Other long term (current) drug therapy: Secondary | ICD-10-CM | POA: Diagnosis not present

## 2014-06-20 DIAGNOSIS — Z85810 Personal history of malignant neoplasm of tongue: Secondary | ICD-10-CM | POA: Diagnosis not present

## 2014-06-20 DIAGNOSIS — C01 Malignant neoplasm of base of tongue: Secondary | ICD-10-CM | POA: Diagnosis not present

## 2014-06-20 DIAGNOSIS — Z08 Encounter for follow-up examination after completed treatment for malignant neoplasm: Secondary | ICD-10-CM | POA: Diagnosis not present

## 2014-06-28 DIAGNOSIS — C01 Malignant neoplasm of base of tongue: Secondary | ICD-10-CM | POA: Diagnosis not present

## 2014-06-28 DIAGNOSIS — K5900 Constipation, unspecified: Secondary | ICD-10-CM | POA: Diagnosis not present

## 2014-06-28 DIAGNOSIS — C140 Malignant neoplasm of pharynx, unspecified: Secondary | ICD-10-CM | POA: Diagnosis not present

## 2014-07-05 DIAGNOSIS — D485 Neoplasm of uncertain behavior of skin: Secondary | ICD-10-CM | POA: Diagnosis not present

## 2014-07-05 DIAGNOSIS — D2271 Melanocytic nevi of right lower limb, including hip: Secondary | ICD-10-CM | POA: Diagnosis not present

## 2014-07-05 DIAGNOSIS — L723 Sebaceous cyst: Secondary | ICD-10-CM | POA: Diagnosis not present

## 2014-07-05 DIAGNOSIS — L570 Actinic keratosis: Secondary | ICD-10-CM | POA: Diagnosis not present

## 2014-07-14 DIAGNOSIS — K5900 Constipation, unspecified: Secondary | ICD-10-CM | POA: Diagnosis not present

## 2014-08-24 ENCOUNTER — Ambulatory Visit: Payer: Medicare Other | Attending: Gastroenterology | Admitting: Physical Therapy

## 2014-08-24 ENCOUNTER — Ambulatory Visit: Payer: Medicare Other | Admitting: Physical Therapy

## 2014-08-24 ENCOUNTER — Encounter: Payer: Self-pay | Admitting: Physical Therapy

## 2014-08-24 DIAGNOSIS — M6289 Other specified disorders of muscle: Secondary | ICD-10-CM

## 2014-08-24 DIAGNOSIS — K5902 Outlet dysfunction constipation: Secondary | ICD-10-CM | POA: Diagnosis not present

## 2014-08-24 DIAGNOSIS — Z923 Personal history of irradiation: Secondary | ICD-10-CM | POA: Diagnosis not present

## 2014-08-24 DIAGNOSIS — C01 Malignant neoplasm of base of tongue: Secondary | ICD-10-CM | POA: Diagnosis not present

## 2014-08-24 DIAGNOSIS — R293 Abnormal posture: Secondary | ICD-10-CM | POA: Insufficient documentation

## 2014-08-24 DIAGNOSIS — K219 Gastro-esophageal reflux disease without esophagitis: Secondary | ICD-10-CM | POA: Diagnosis not present

## 2014-08-24 DIAGNOSIS — Z9221 Personal history of antineoplastic chemotherapy: Secondary | ICD-10-CM | POA: Insufficient documentation

## 2014-08-24 DIAGNOSIS — M436 Torticollis: Secondary | ICD-10-CM | POA: Insufficient documentation

## 2014-08-24 NOTE — Patient Instructions (Signed)
Instructed patient on where to buy the internal anal sensor to work on Pelvic floor EMG.

## 2014-08-24 NOTE — Therapy (Signed)
[facility] [address] [place], [place], [address] Phone: [phone]   Fax:  [phone]  Physical Therapy Evaluation  Patient Details  Name: Thomas Hendricks MRN: [id number] Date of Birth: 1950-07-29 Referring Provider:  Kathreen Cosier, MD  Encounter Date: 08/24/2014      PT End of Session - 08/24/14 1713    Visit Number 1   Date for PT Re-Evaluation 11/16/14   PT Start Time 5035   PT Stop Time 1530   PT Time Calculation (min) 45 min   Activity Tolerance Patient tolerated treatment well   Behavior During Therapy [facility] for tasks assessed/performed      Past Medical History  Diagnosis Date  . 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 Allergic rhinitis   . Esophageal reflux   . Dysplastic skin lesion     Past Surgical History  Procedure Laterality Date  . Biopsy of  base of tongue Left 01/13/14  . Oral surgery tooth extraction    . Tonsillectomy    . Colonoscopy      There were no vitals filed for this visit.  Visit Diagnosis:  PFD (pelvic floor dysfunction) - Plan: PT plan of care cert/re-cert      Subjective Assessment - 08/24/14 1452    Subjective Patient reports his cancer treatment caused constipation and was impacted. 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  Patient reports he uses stool softners. Patient has to drink Boost plus 6 times per day due to  tongue cancer.  Patient reports normal bowel movements 1 time per day.  Patient is working on regular food and inclucing fiber..  Patient reports his hemmorriods have not been acting up.  Patient reports when he strains the hemorroids will be aggravated.  Patient has to push bowel movement  with strain one time per week.    Pertinent History Base of tongue cancer   Diagnostic tests Anorectal mamometry- showed weak sphinter, unable to expel the balloon, resting level 6uv,    Patient Stated Goals prevent  constipation, not aggravate the hemrroids,    Currently in Pain? No/denies   Multiple Pain Sites No            [facility] PT Assessment - 08/24/14 0001    Assessment   Medical Diagnosis constipation due to outlet dysfunction   Onset Date 02/23/14   Prior Therapy None   Precautions   Precautions Other (comment)   Precaution Comments No ultrasound   Balance Screen   Has the patient fallen in the past 6 months No   Has the patient had a decrease in activity level because of a fear of falling?  No   Is the patient reluctant to leave their home because of a fear of falling?  No   Prior Function   Leisure exercise at a gym  2 times per week   Observation/Other Assessments   Focus on Therapeutic Outcomes (FOTO)  32% limitation   ROM / Strength   AROM / PROM / Strength --  bil. hip strength 5/5   AROM   Lumbar Flexion decreased by 25%   Lumbar Extension decreased by 25%   Lumbar - Right Side Bend decreased by 25%   Lumbar - Left Side Bend decreased by 25%                 Pelvic Floor Special Questions - 08/24/14 0001  Urinary Leakage No   Fecal incontinence No   Pelvic Floor Internal Exam Patient confirms identification and approved physical therapist to assess patients muscle strength and integrity   Palpation pulling forward of the puborectalis muscle  verbal cues to contract correctly   Strength weak squeeze, no lift                    PT Short Term Goals - 08/24/14 1716    PT SHORT TERM GOAL #1   Title understand how to have a bowel movement correctly while relaxing the pelvic floor   Time 4   Period Weeks   Status New   PT SHORT TERM GOAL #2   Title ability to contract the pelvic floor without holding his breath   Time 4   Period Weeks   Status New   PT SHORT TERM GOAL #3   Title understand howt to perform abdominal massage to decrease constipation   Time 4   Period Weeks   Status New           PT Long Term Goals - 08/24/14 1717     PT LONG TERM GOAL #1   Title straining to have a bowel movement decreased >/= 60%   Time 12   Period Weeks   Status New   PT LONG TERM GOAL #2   Title pelvic floor resting tone decreased </= 3uv   Time 12   Period Weeks   Status New   PT LONG TERM GOAL #3   Title pelvic floor strength >/= 4/5   Time 12   Period Weeks   Status New   PT LONG TERM GOAL #4   Title ability to expel the anal sensor due to increased coordination of the pelvic floor   Time 12   Period [place] - 08/24/14 1714    Clinical Impression Statement Patient has a weak pelvic floor.  Patient has difficulty with coordinating pelvic floor contraction with pulling up and breathing.    Pt will benefit from skilled therapeutic intervention in order to improve on the following deficits Decreased coordination;Decreased endurance;Decreased activity tolerance;Decreased strength;Decreased mobility   Rehab Potential Good   PT Frequency 1x / week   PT Duration 12 weeks   PT Treatment/Interventions Patient/family education;Therapeutic activities;Therapeutic exercise;Manual techniques;Biofeedback;Neuromuscular re-education;Functional mobility training   PT Next Visit Plan Pelvic floor EMG, toileting   PT Home Exercise Plan toileting   Consulted and Agree with Plan of Care Patient         Problem List Patient Active Problem List   Diagnosis Date Noted  . Malignant neoplasm of base of tongue 01/25/2014    Kelsey Durflinger,PT 08/24/2014, 5:21 PM  [facility] [address] [place], [place], [address] Phone: [phone]   Fax:  [phone]

## 2014-08-30 DIAGNOSIS — C01 Malignant neoplasm of base of tongue: Secondary | ICD-10-CM | POA: Diagnosis not present

## 2014-08-30 DIAGNOSIS — Z6821 Body mass index (BMI) 21.0-21.9, adult: Secondary | ICD-10-CM | POA: Diagnosis not present

## 2014-09-05 ENCOUNTER — Ambulatory Visit: Payer: Medicare Other | Admitting: Physical Therapy

## 2014-09-06 ENCOUNTER — Ambulatory Visit: Payer: Medicare Other | Admitting: Physical Therapy

## 2014-09-06 DIAGNOSIS — R293 Abnormal posture: Secondary | ICD-10-CM

## 2014-09-06 DIAGNOSIS — R29898 Other symptoms and signs involving the musculoskeletal system: Secondary | ICD-10-CM

## 2014-09-06 DIAGNOSIS — K5902 Outlet dysfunction constipation: Secondary | ICD-10-CM | POA: Diagnosis not present

## 2014-09-06 DIAGNOSIS — M436 Torticollis: Secondary | ICD-10-CM | POA: Diagnosis not present

## 2014-09-06 DIAGNOSIS — K219 Gastro-esophageal reflux disease without esophagitis: Secondary | ICD-10-CM | POA: Diagnosis not present

## 2014-09-06 DIAGNOSIS — C01 Malignant neoplasm of base of tongue: Secondary | ICD-10-CM | POA: Diagnosis not present

## 2014-09-06 DIAGNOSIS — Z9221 Personal history of antineoplastic chemotherapy: Secondary | ICD-10-CM | POA: Diagnosis not present

## 2014-09-06 NOTE — Therapy (Signed)
[place], [place], [address] Phone: [phone]   Fax:  [phone]  Physical Therapy Evaluation  Patient Details  Name: Thomas Hendricks MRN: [id number] Date of Birth: 07-Sep-1950 Referring Provider:  Aundra Millet, MD  Encounter Date: 09/06/2014      PT End of Session - 09/06/14 1657    Visit Number 1  for neck   Number of Visits 9  for neck   Date for PT Re-Evaluation 10/04/14  for neck   PT Start Time 1558   PT Stop Time 1650   PT Time Calculation (min) 52 min   Activity Tolerance Patient tolerated treatment well   Behavior During Therapy [facility] for tasks assessed/performed      Past Medical History  Diagnosis Date  . 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 Allergic rhinitis   . Esophageal reflux   . Dysplastic skin lesion     Past Surgical History  Procedure Laterality Date  . Biopsy of  base of tongue Left 01/13/14  . Oral surgery tooth extraction    . Tonsillectomy    . Colonoscopy      There were no vitals filed for this visit.  Visit Diagnosis:  Stiffness of neck - Plan: PT plan of care cert/re-cert  Abnormal posture - Plan: PT plan of care cert/re-cert  Muscular deconditioning - Plan: PT plan of care cert/re-cert      Subjective Assessment - 09/06/14 1602    Subjective Reports some neck stiffness since treatment was completed; may have some swelling too.   Pertinent History HPV positive base of tongue cancer treated in a trial of chemoradiation at lighter doses at [facility].  5 months since treatment completed.  PET in February showed no evidence of disease.  Lost 20 lbs. and has put back on about 8 lbs.  h/o back problems which are helped by Aleve and exercise daily.  Does core workout at least once a week.   Currently in Pain? No/denies            [facility] PT Assessment - 09/06/14 0001    Assessment   Medical Diagnosis s/p  chemoradiation for head/neck cancer   Onset Date 01/24/14   Precautions   Precautions Other (comment)   Precaution Comments cancer precautions   Restrictions   Weight Bearing Restrictions No   Balance Screen   Has the patient fallen in the past 6 months No   Has the patient had a decrease in activity level because of a fear of falling?  No   Is the patient reluctant to leave their home because of a fear of falling?  No   Prior Function   Level of Independence Independent with basic ADLs;Independent with homemaking with ambulation;Independent with gait   Vocation Full time employment   Vocation Requirements attorney   Leisure weekend exerciser ; gets full body massage once a month   Posture/Postural Control   Posture/Postural Control Postural limitations   Postural Limitations Forward head   ROM / Strength   AROM / PROM / Strength AROM   AROM   Overall AROM  Deficits  mouth opening 3.3 cm. 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   Treatment   Past Chemotherapy Treatment Yes   Date 03/24/14   Past Radiation Treatment Yes   Date 03/24/14   Body Site neck   Lymphedema Assessments   Lymphedema Assessments Head and Neck   Head and Neck   4 cm superior to sternal notch around neck 36 cm   6 cm superior to sternal notch around neck 35.4 cm   8 cm superior to sternal notch around neck 36.2 cm                        PT  Education - 09/06/14 1656    Education provided Yes   Education Details neck AROM with overpressure; chin retraction   Person(s) Educated Patient   Methods Explanation;Demonstration  has handout from head & neck clinic   Comprehension Returned demonstration          PT Short Term Goals - 08/24/14 1716    PT SHORT TERM GOAL #1   Title understand how to have a bowel movement correctly while relaxing the pelvic floor   Time 4   Period Weeks   Status New   PT SHORT TERM GOAL #2   Title ability to contract the pelvic floor without holding his breath   Time 4   Period Weeks   Status New   PT SHORT TERM GOAL #3   Title understand howt to perform abdominal massage to decrease constipation   Time 4   Period Weeks   Status New           PT Long Term Goals - 08/24/14 1717    PT LONG TERM GOAL #1   Title straining to have a bowel movement decreased >/= 60%   Time 12   Period Weeks   Status New   PT LONG TERM GOAL #2   Title pelvic floor resting tone decreased </= 3uv   Time 12   Period Weeks   Status New   PT LONG TERM GOAL #3   Title pelvic floor strength >/= 4/5   Time 12   Period Weeks   Status New   PT LONG TERM GOAL #4   Title ability to expel the anal sensor due to increased coordination of the pelvic floor   Time 12   Period Weeks   Status [facility] Goals - 09/06/14 1714    CC Long Term Goal  #1   Title Active neck sidebend limited not more than 50%.   Baseline Limited 75%; was 50% limited prior to treatment.   Time 4   Period Weeks   Status New   CC Long Term Goal  #2   Title Independent with home exercise program for neck ROM and postural muscle strengthening.   Baseline has not been doing HEP instructed prior to treatment   Time 4   Period Weeks   Status New   CC Long Term Goal  #3   Title able to demonstrate erect  neck posture and maintain for 5 minutes without cueing   Baseline significant forward head position   Time 4    Period Weeks   Status New   CC Long Term Goal  #4   Title Pt. will report perception of at least 50% improved function and flexibility of neck.   Time 4   Period Weeks   Status New            Plan - 09/22/2014 1709    Clinical Impression Statement Patient who is s/p chemoradiation treatment (completed 5 months ago) for head & neck cancer, now with decreased ROM of neck interfering with ADLs.  He is also at risk for lymphedema, though it does not appear that he has swelling currently.  His neck measurements are smaller than when he was measured prior to treatment, but he has lost weight since then as well.  He also has a postural dysfunction (significant forward head).                                                  Pt will benefit from skilled therapeutic intervention in order to improve on the following deficits Decreased range of motion;Postural dysfunction;Increased fascial restricitons   Rehab Potential Good   PT Frequency 2x / week   PT Duration 4 weeks   PT Treatment/Interventions Therapeutic exercise;Patient/family education;Manual techniques;Passive range of motion;Manual lymph drainage   PT Next Visit Plan Begin neck PROM, myofascial release, and adding to HEP for neck ROM.  Instruct in manual lymph drainage as deemed appropriate during the course of treatment.   PT Home Exercise Plan neck AROM with overpressure several times a day--see education section   Consulted and Agree with Plan of Care Patient          G-Codes - 09-22-2014 1717    Functional Assessment Tool Used clinical judgement of posture and of neck AROM   Functional Limitation Changing and maintaining body position   Changing and Maintaining Body Position Current Status (O2947) At least 40 percent but less than 60 percent impaired, limited or restricted   Changing and Maintaining Body Position Goal Status (M5465) At least 20 percent but less than 40 percent impaired, limited or restricted       Problem  List Patient Active Problem List   Diagnosis Date Noted  . Malignant neoplasm of base of tongue 01/25/2014    Reita Shindler 22-Sep-2014, 5:21 PM  [place], [place], [address] Phone: [phone]   Fax:  [place], PT 2014/09/22 5:22 PM

## 2014-09-07 NOTE — Therapy (Addendum)
[place], [place], [address] Phone: [phone]   Fax:  [phone]  Physical Therapy Evaluation  Patient Details  Name: Thomas Hendricks MRN: [id number] Date of Birth: 09-03-1950 Referring Provider:  Aundra Millet, MD  Encounter Date: 09/06/2014      PT End of Session - 09/06/14 1657    Visit Number 1  for neck   Number of Visits 9  for neck   Date for PT Re-Evaluation 10/04/14  for neck   PT Start Time 1558   PT Stop Time 1650   PT Time Calculation (min) 52 min   Activity Tolerance Patient tolerated treatment well   Behavior During Therapy [facility] for tasks assessed/performed      Past Medical History  Diagnosis Date  . 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 Allergic rhinitis   . Esophageal reflux   . Dysplastic skin lesion     Past Surgical History  Procedure Laterality Date  . Biopsy of  base of tongue Left 01/13/14  . Oral surgery tooth extraction    . Tonsillectomy    . Colonoscopy      There were no vitals filed for this visit.  Visit Diagnosis:  Stiffness of neck - Plan: PT plan of care cert/re-cert  Abnormal posture - Plan: PT plan of care cert/re-cert  Muscular deconditioning - Plan: PT plan of care cert/re-cert      Subjective Assessment - 09/06/14 1602    Subjective Reports some neck stiffness since treatment was completed; may have some swelling too.   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  5 months since treatment completed.  PET in February showed no evidence of disease.  Lost 20 lbs. and has put back on about 8 lbs.  h/o back problems which are helped by Aleve and exercise daily.  Does core workout at least once a week.   Currently in Pain? No/denies               LYMPHEDEMA/ONCOLOGY QUESTIONNAIRE - 09/06/14 1620    Type   Cancer Type base  of tongue HPV cancer diagnosed in September 2015   Treatment   Past Chemotherapy Treatment Yes   Date 03/24/14   Past Radiation Treatment Yes   Date 03/24/14   Body Site neck   Lymphedema Assessments   Lymphedema Assessments Head and Neck   Head and Neck   4 cm superior to sternal notch around neck 36 cm   6 cm superior to sternal notch around neck 35.4 cm   8 cm superior to sternal notch around neck 36.2 cm                        PT Education - 09/06/14 1656    Education provided Yes   Education Details neck AROM with overpressure; chin retraction   Person(s) Educated Patient   Methods Explanation;Demonstration  has handout from head & neck clinic   Comprehension Returned demonstration          PT Short Term Goals - 08/24/14 1716    PT SHORT TERM GOAL #1   Title understand how to have a bowel movement correctly while relaxing the pelvic floor   Time 4   Period Weeks   Status New   PT SHORT TERM GOAL #2   Title  ability to contract the pelvic floor without holding his breath   Time 4   Period Weeks   Status New   PT SHORT TERM GOAL #3   Title understand howt to perform abdominal massage to decrease constipation   Time 4   Period Weeks   Status New           PT Long Term Goals - 08/24/14 1717    PT LONG TERM GOAL #1   Title straining to have a bowel movement decreased >/= 60%   Time 12   Period Weeks   Status New   PT LONG TERM GOAL #2   Title pelvic floor resting tone decreased </= 3uv   Time 12   Period Weeks   Status New   PT LONG TERM GOAL #3   Title pelvic floor strength >/= 4/5   Time 12   Period Weeks   Status New   PT LONG TERM GOAL #4   Title ability to expel the anal sensor due to increased coordination of the pelvic floor   Time 12   Period [facility] Goals - 09/06/14 1714    CC Long Term Goal  #1   Title Active neck sidebend limited not more than 50%.   Baseline Limited 75%;  was 50% limited prior to treatment.   Time 4   Period Weeks   Status New   CC Long Term Goal  #2   Title Independent with home exercise program for neck ROM and postural muscle strengthening.   Baseline has not been doing HEP instructed prior to treatment   Time 4   Period Weeks   Status New   CC Long Term Goal  #3   Title able to demonstrate erect neck posture and maintain for 5 minutes without cueing   Baseline significant forward head position   Time 4   Period Weeks   Status New   CC Long Term Goal  #4   Title Pt. will report perception of at least 50% improved function and flexibility of neck.   Time 4   Period Weeks   Status New            Plan - 09/06/14 1709    Clinical Impression Statement Patient who is s/p chemoradiation treatment (completed 5 months ago) for head & neck cancer, now with decreased ROM of neck interfering with ADLs.  He is also at risk for lymphedema, though it does not appear that he has swelling currently.  His neck measurements are smaller than when he was measured prior to treatment, but he has lost weight since then as well.  He also has a postural dysfunction (significant forward head).                                                  Pt will benefit from skilled therapeutic intervention in order to improve on the following deficits Decreased range of motion;Postural dysfunction;Increased fascial restricitons   Rehab Potential Good   PT Frequency 2x / week   PT Duration 4 weeks   PT Treatment/Interventions Therapeutic exercise;Patient/family education;Manual techniques;Passive range of motion;Manual lymph drainage   PT Next Visit Plan Begin neck PROM, myofascial release, and adding to HEP for neck ROM.  Instruct  in manual lymph drainage as deemed appropriate during the course of treatment.   PT Home Exercise Plan neck AROM with overpressure several times a day--see education section   Consulted and Agree with Plan of Care Patient           G-Codes - 09/26/2014 0905    Functional Assessment Tool Used clinical judgement of posture and of neck AROM   Functional Limitation Changing and maintaining body position   Changing and Maintaining Body Position Current Status (Z6109) At least 40 percent but less than 60 percent impaired, limited or restricted   Changing and Maintaining Body Position Goal Status (U0454) At least 20 percent but less than 40 percent impaired, limited or restricted   Changing and Maintaining Body Position Discharge Status (U9811) At least 40 percent but less than 60 percent impaired, limited or restricted       Problem List Patient Active Problem List   Diagnosis Date Noted  . Malignant neoplasm of base of tongue 01/25/2014    SALISBURY,DONNA 26-Sep-2014, 9:07 AM  [place], [place], [address] Phone: [phone]   Fax:  [place], PT 09/26/2014 9:08 AM

## 2014-09-11 ENCOUNTER — Telehealth (HOSPITAL_COMMUNITY): Payer: Self-pay | Admitting: Dental General Practice

## 2014-09-13 ENCOUNTER — Encounter: Payer: Self-pay | Admitting: Physical Therapy

## 2014-09-13 ENCOUNTER — Ambulatory Visit: Payer: Medicare Other | Attending: Gastroenterology | Admitting: Physical Therapy

## 2014-09-13 DIAGNOSIS — C01 Malignant neoplasm of base of tongue: Secondary | ICD-10-CM | POA: Insufficient documentation

## 2014-09-13 DIAGNOSIS — M436 Torticollis: Secondary | ICD-10-CM | POA: Insufficient documentation

## 2014-09-13 DIAGNOSIS — Z9221 Personal history of antineoplastic chemotherapy: Secondary | ICD-10-CM | POA: Diagnosis not present

## 2014-09-13 DIAGNOSIS — M6289 Other specified disorders of muscle: Secondary | ICD-10-CM

## 2014-09-13 DIAGNOSIS — Z923 Personal history of irradiation: Secondary | ICD-10-CM | POA: Diagnosis not present

## 2014-09-13 DIAGNOSIS — K5902 Outlet dysfunction constipation: Secondary | ICD-10-CM | POA: Insufficient documentation

## 2014-09-13 DIAGNOSIS — K219 Gastro-esophageal reflux disease without esophagitis: Secondary | ICD-10-CM | POA: Insufficient documentation

## 2014-09-13 DIAGNOSIS — R293 Abnormal posture: Secondary | ICD-10-CM | POA: Diagnosis not present

## 2014-09-13 NOTE — Therapy (Signed)
[facility] [address], [place], [place], [address] Phone: [phone]   Fax:  [phone]  Physical Therapy Treatment  Patient Details  Name: Thomas Hendricks MRN: [id number] Date of Birth: 1951/04/24 Referring Provider:  Kathreen Cosier, MD  Encounter Date: 09/13/2014      PT End of Session - 09/13/14 1609    Visit Number 2  pelvic floor   Date for PT Re-Evaluation 11/16/14  pelvic floor   PT Start Time 1530   PT Stop Time 1615   PT Time Calculation (min) 45 min   Equipment Utilized During Treatment --  internal anal sensor   Activity Tolerance Patient tolerated treatment well   Behavior During Therapy [facility] for tasks assessed/performed      Past Medical History  Diagnosis Date  . 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 Allergic rhinitis   . Esophageal reflux   . Dysplastic skin lesion     Past Surgical History  Procedure Laterality Date  . Biopsy of  base of tongue Left 01/13/14  . Oral surgery tooth extraction    . Tonsillectomy    . Colonoscopy      There were no vitals filed for this visit.  Visit Diagnosis:  PFD (pelvic floor dysfunction)      Subjective Assessment - 09/13/14 1541    Subjective Patient reports no changes since initial evaluation.  Patient reports no constipation. Patient reports inflammation of the hemmorroids with no pain.    Pertinent History HPV positive base of tongue cancer treated in a trial of chemoradiation at lighter doses at [facility].  5 months since treatment completed.  PET in February showed no evidence of disease.  Lost 20 lbs. and has put back on about 8 lbs.  h/o back problems which are helped by Aleve and exercise daily.  Does core workout at least once a week.   Patient Stated Goals prevent constipation, not aggravate the hemrroids,    Currently in Pain? No/denies   Multiple Pain Sites No       Pelvic floor EMG with  internal anal sensor assessment: Resting level prior to exercise: 9.51uv 3 quick contractions 28.87uv with verbal cues to breathe and difficulty relaxing between contraction.  10 second contraction 37.78 uv fatique after 4 seconds with difficulty relaxing 20 second contraction 30.30 uv Resting tone after exercise 5.56 uv Worked on muscle control with pelvic floor EMG with 2 quick flicks then hold 5 seconds at 50% strength.  Difficulty with relaxation.  Sitting on rolled up towel while pushing sensor out and relaxing the pelvic floor.                Pelvic Floor Special Questions - 09/13/14 0001    Biofeedback sensor type Rectal   Biofeedback Activity Quick contraction;10 second hold                   PT Education - 09/13/14 1618    Education provided Yes   Education Details pelvic floor strengthening, pelvic floor relaxation when sitting on commode to push sensor out   Person(s) Educated Patient   Methods Explanation;Demonstration;Tactile cues;Verbal cues;Handout   Comprehension Verbalized understanding;Returned demonstration          PT Short Term Goals - 09/13/14 1619    PT SHORT TERM GOAL #1   Title understand how to have a bowel movement correctly while relaxing the pelvic floor  Period Weeks   Status Achieved   PT SHORT TERM GOAL #2   Title ability to contract the pelvic floor without holding his breath   Time 4   Period Weeks   Status On-going  requires verbal cues   PT SHORT TERM GOAL #3   Title understand howt to perform abdominal massage to decrease constipation   Time 4   Period Weeks   Status New  not educated yet           PT Long Term Goals - 08/24/14 1717    PT LONG TERM GOAL #1   Title straining to have a bowel movement decreased >/= 60%   Time 12   Period Weeks   Status New   PT LONG TERM GOAL #2   Title pelvic floor resting tone decreased </= 3uv   Time 12   Period Weeks   Status New   PT LONG TERM GOAL #3   Title  pelvic floor strength >/= 4/5   Time 12   Period Weeks   Status New   PT LONG TERM GOAL #4   Title ability to expel the anal sensor due to increased coordination of the pelvic floor   Time 12   Period [facility] Goals - 09/06/14 1714    CC Long Term Goal  #1   Title Active neck sidebend limited not more than 50%.   Baseline Limited 75%; was 50% limited prior to treatment.   Time 4   Period Weeks   Status New   CC Long Term Goal  #2   Title Independent with home exercise program for neck ROM and postural muscle strengthening.   Baseline has not been doing HEP instructed prior to treatment   Time 4   Period Weeks   Status New   CC Long Term Goal  #3   Title able to demonstrate erect neck posture and maintain for 5 minutes without cueing   Baseline significant forward head position   Time 4   Period Weeks   Status New   CC Long Term Goal  #4   Title Pt. will report perception of at least 50% improved function and flexibility of neck.   Time 4   Period Weeks   Status New            Plan - 09/13/14 1717    Clinical Impression Statement Patient is a 64 year old male with a pelvic floor that has trouble relaxing after contraction and fatiques after 4 seconds while on pelvic floor EMG.  Patient was able to relax pelvic floor on the pelvic floor EMG while to facilitate a bowel movement by pushing the sensor out after 3 tries.  The initial tial he would tighten his pelvic floor.    Pt will benefit from skilled therapeutic intervention in order to improve on the following deficits Decreased coordination;Decreased endurance;Decreased activity tolerance;Decreased strength;Decreased mobility   Rehab Potential Good   PT Frequency 1x / week   PT Duration 12 weeks   PT Treatment/Interventions Patient/family education;Therapeutic exercise;Therapeutic activities;Biofeedback;Manual techniques;Neuromuscular re-education;Functional mobility training    PT Next Visit Plan Pelvic floor EMG for strengthening and pushing sensor out.    PT Home Exercise Plan Current HEP   Recommended Other Services None   Consulted and Agree with Plan of Care Patient        Problem List Patient Active Problem List  Diagnosis Date Noted  . Malignant neoplasm of base of tongue 01/25/2014    Macie Baum,CHERYLPT 09/13/2014, 5:22 PM  [facility] [address] [place], [place], [address] Phone: [phone]   Fax:  [phone]

## 2014-09-13 NOTE — Patient Instructions (Signed)
Quick Contraction: Gravity Eliminated (Hook-Lying)   Lie with hips and knees bent. Quickly squeeze then fully relax pelvic floor. Perform _5__  reps. Rest for _5 seconds__ seconds between sets. Then hold for 5 seconds at 50% contraction 5 times. 10 times 2 times per day   Copyright  VHI. All rights reserved.  Practice sitting on the commode with sensor in the anal area.  Have saran wrap on the toilet.  Practice pushing sensor out and the saran wrap will catch it. Patient able to return demonstration correctly with above exercises.

## 2014-09-19 ENCOUNTER — Ambulatory Visit: Payer: Medicare Other | Admitting: Physical Therapy

## 2014-09-19 DIAGNOSIS — M436 Torticollis: Secondary | ICD-10-CM

## 2014-09-19 DIAGNOSIS — Z9221 Personal history of antineoplastic chemotherapy: Secondary | ICD-10-CM | POA: Diagnosis not present

## 2014-09-19 DIAGNOSIS — K219 Gastro-esophageal reflux disease without esophagitis: Secondary | ICD-10-CM | POA: Diagnosis not present

## 2014-09-19 DIAGNOSIS — R29898 Other symptoms and signs involving the musculoskeletal system: Secondary | ICD-10-CM

## 2014-09-19 DIAGNOSIS — K5902 Outlet dysfunction constipation: Secondary | ICD-10-CM | POA: Diagnosis not present

## 2014-09-19 DIAGNOSIS — R293 Abnormal posture: Secondary | ICD-10-CM

## 2014-09-19 DIAGNOSIS — C01 Malignant neoplasm of base of tongue: Secondary | ICD-10-CM | POA: Diagnosis not present

## 2014-09-19 NOTE — Patient Instructions (Signed)
Try to do home exercise program given last time. Note whether today's session resulted in soreness.

## 2014-09-19 NOTE — Therapy (Signed)
[place], [place], [address] Phone: [phone]   Fax:  [phone]  Physical Therapy Treatment  Patient Details  Name: Thomas Hendricks MRN: [id number] Date of Birth: 08/16/1950 Referring Provider:  Lavone Orn, MD  Encounter Date: 09/19/2014      PT End of Session - 09/19/14 1716    Visit Number 3  for neck   Number of Visits 9   Date for PT Re-Evaluation 11/16/14   PT Start Time 9163   PT Stop Time 1607   PT Time Calculation (min) 50 min   Activity Tolerance Patient tolerated treatment well   Behavior During Therapy [facility] for tasks assessed/performed      Past Medical History  Diagnosis Date  . 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 Allergic rhinitis   . Esophageal reflux   . Dysplastic skin lesion     Past Surgical History  Procedure Laterality Date  . Biopsy of  base of tongue Left 01/13/14  . Oral surgery tooth extraction    . Tonsillectomy    . Colonoscopy      There were no vitals filed for this visit.  Visit Diagnosis:  Stiffness of neck  Abnormal posture  Muscular deconditioning      Subjective Assessment - 09/19/14 1519    Subjective I will admit that I haven't been a good patient.  I haven't had time to do my stretches.   Currently in Pain? No/denies                         [facility] Adult PT Treatment/Exercise - 09/19/14 0001    Exercises   Exercises Shoulder   Shoulder Exercises: Supine   Horizontal ABduction AAROM  supine over foam roll with arms in horiz. abd. just > 90 deg   Manual Therapy   Manual Therapy Myofascial release;Passive ROM   Myofascial Release crosshands technique at chest in horizontal direction; occipito-atlantal release followed by cervical traction with concurrent sternal distraction   Passive ROM neck in rotation and sidebend; levator stretches; chin retraction, then gentle extension, all to  patient tolerance; some stroking of scalenes with sidebend stretch as well as myofascial release for that; contract-relax for neck sidebend also; pect minor stretches                  PT Short Term Goals - 09/13/14 1619    PT SHORT TERM GOAL #1   Title understand how to have a bowel movement correctly while relaxing the pelvic floor   Period Weeks   Status Achieved   PT SHORT TERM GOAL #2   Title ability to contract the pelvic floor without holding his breath   Time 4   Period Weeks   Status On-going  requires verbal cues   PT SHORT TERM GOAL #3   Title understand howt to perform abdominal massage to decrease constipation   Time 4   Period Weeks   Status New  not educated yet           PT Long Term Goals - 08/24/14 1717    PT LONG TERM GOAL #1   Title straining to have a bowel movement decreased >/= 60%   Time 12   Period Weeks   Status New   PT LONG TERM GOAL #2   Title pelvic floor resting tone decreased </= 3uv   Time 12  Period Weeks   Status New   PT LONG TERM GOAL #3   Title pelvic floor strength >/= 4/5   Time 12   Period Weeks   Status New   PT LONG TERM GOAL #4   Title ability to expel the anal sensor due to increased coordination of the pelvic floor   Time 12   Period [facility] Goals - 09/19/14 1720    CC Long Term Goal  #1   Status On-going   CC Long Term Goal  #2   Status On-going   CC Long Term Goal  #3   Status On-going   CC Long Term Goal  #4   Status On-going            Plan - 09/19/14 1717    Clinical Impression Statement Patient with significant neck tightness, some of which preceded his treatment for oral cancer; he tolerated treatment with stretching and myofascial release well today, and noted some increased flexibility at end of session.   Pt will benefit from skilled therapeutic intervention in order to improve on the following deficits Increased fascial restricitons;Decreased  range of motion;Postural dysfunction   Rehab Potential Good   PT Frequency 1x / week  for neck (pt. unable to come more often due to multiple other commitments)   PT Duration 4 weeks  for neck   PT Treatment/Interventions Manual techniques;Passive range of motion   PT Next Visit Plan Assess first full neck treatment response; continue manual and myofascial work.   Consulted and Agree with Plan of Care Patient        Problem List Patient Active Problem List   Diagnosis Date Noted  . Malignant neoplasm of base of tongue 01/25/2014    SALISBURY,DONNA 09/19/2014, 5:21 PM  [place], [place], [address] Phone: [phone]   Fax:  [place], PT 09/19/2014 5:22 PM

## 2014-09-21 ENCOUNTER — Ambulatory Visit: Payer: Medicare Other | Admitting: Physical Therapy

## 2014-09-21 ENCOUNTER — Encounter: Payer: Self-pay | Admitting: Physical Therapy

## 2014-09-21 DIAGNOSIS — M436 Torticollis: Secondary | ICD-10-CM | POA: Diagnosis not present

## 2014-09-21 DIAGNOSIS — K5902 Outlet dysfunction constipation: Secondary | ICD-10-CM | POA: Diagnosis not present

## 2014-09-21 DIAGNOSIS — Z9221 Personal history of antineoplastic chemotherapy: Secondary | ICD-10-CM | POA: Diagnosis not present

## 2014-09-21 DIAGNOSIS — K219 Gastro-esophageal reflux disease without esophagitis: Secondary | ICD-10-CM | POA: Diagnosis not present

## 2014-09-21 DIAGNOSIS — C01 Malignant neoplasm of base of tongue: Secondary | ICD-10-CM | POA: Diagnosis not present

## 2014-09-21 DIAGNOSIS — R293 Abnormal posture: Secondary | ICD-10-CM | POA: Diagnosis not present

## 2014-09-21 DIAGNOSIS — M6289 Other specified disorders of muscle: Secondary | ICD-10-CM

## 2014-09-21 NOTE — Therapy (Signed)
[facility] [address], [place], [place], [address] Phone: [phone]   Fax:  [phone]  Physical Therapy Treatment  Patient Details  Name: CHAIM GATLEY MRN: [id number] Date of Birth: [birth year] Referring Provider:  Kathreen Cosier, MD  Encounter Date: 09/21/2014      PT End of Session - 09/21/14 1704    Visit Number 3  pelvic floor   Date for PT Re-Evaluation 11/16/14   PT Start Time 1615   PT Stop Time 1710   PT Time Calculation (min) 55 min   Equipment Utilized During Treatment --  internal anal sensor   Activity Tolerance Patient tolerated treatment well   Behavior During Therapy [facility] for tasks assessed/performed      Past Medical History  Diagnosis Date  . 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 Allergic rhinitis   . Esophageal reflux   . Dysplastic skin lesion     Past Surgical History  Procedure Laterality Date  . Biopsy of  base of tongue Left 01/13/14  . Oral surgery tooth extraction    . Tonsillectomy    . Colonoscopy      There were no vitals filed for this visit.  Visit Diagnosis:  PFD (pelvic floor dysfunction)      Subjective Assessment - 09/21/14 1626    Subjective I have not been doing my exercises due to a busy week and having other appointments. I am still using the stool softner and the Miralx 1 time per month now. Patient reports he has not been straining to have a bowel movement. Patient has not gotten a Physiological scientist.    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  5 months since treatment completed.  PET in February showed no evidence of disease.  Lost 20 lbs. and has put back on about 8 lbs.  h/o back problems which are helped by Aleve and exercise daily.  Does core workout at least once a week.   Diagnostic tests Anorectal mamometry- showed weak sphinter,  unable to expel the balloon, resting level 6uv,    Patient Stated Goals prevent constipation, not aggravate the hemrroids,    Currently in Pain? No/denies                      Pelvic Floor Special Questions - 09/21/14 0001    Biofeedback sensor type Rectal   Biofeedback Activity 10 second hold;Bulging     Sitting on sides of yoga mat to leave room for the sensor.  Worked on 10 second contraction with breathing instead of holding his breath and pelvic floor relaxation to facilitate bowel movement and pushing the sensor out.                PT Education - 09/21/14 1703    Education provided Yes   Education Details reviewed past HEP   Person(s) Educated Patient   Methods Explanation   Comprehension Verbalized understanding          PT Short Term Goals - 09/21/14 1636    PT SHORT TERM GOAL #3   Title understand howt to perform abdominal massage to decrease constipation   Time 4   Period Weeks   Status Achieved           PT Long Term Goals - 09/21/14 1636    PT  LONG TERM GOAL #1   Title straining to have a bowel movement decreased >/= 60%   Time 12   Period Weeks   Status On-going  no change due to stool softner           [facility] Goals - 09/19/14 1720    CC Long Term Goal  #1   Status On-going   CC Long Term Goal  #2   Status On-going   CC Long Term Goal  #3   Status On-going   CC Long Term Goal  #4   Status On-going            Plan - 09/21/14 1705    Clinical Impression Statement Patient had difficulty with contracting the pelvic floor without holding his breath.  He requires verbal cues to count out lound and take several breaths during the contraction.  Patien was able relax the pelvic floor to 4uv and contract above 30uv.  Patient was able to relax while trying to push the internal anal sensor out to facilitate a bowel movement but took several trials.  Patient has learned abdominal massage to facilitate bowel  going  through th elarge intestines.    Pt will benefit from skilled therapeutic intervention in order to improve on the following deficits Decreased mobility;Decreased coordination;Decreased endurance;Decreased activity tolerance;Decreased strength   Rehab Potential Good   PT Frequency 1x / week   PT Duration 12 weeks   PT Treatment/Interventions Patient/family education;Therapeutic activities;Therapeutic exercise;Biofeedback;Neuromuscular re-education;Functional mobility training   PT Next Visit Plan see patient in 2 weeks for pelvic floor EMG   PT Home Exercise Plan Current HEP   Consulted and Agree with Plan of Care Patient        Problem List Patient Active Problem List   Diagnosis Date Noted  . Malignant neoplasm of base of tongue 01/25/2014    GRAY,CHERYL,PT 09/21/2014, 5:15 PM  [facility] [address], [place], [place], [address] Phone: [phone]   Fax:  [phone]

## 2014-09-21 NOTE — Patient Instructions (Signed)
About Abdominal Massage  Abdominal massage, also called external colon massage, is a self-treatment circular massage technique that can reduce and eliminate gas and ease constipation. The colon naturally contracts in waves in a clockwise direction starting from inside the right hip, moving up toward the ribs, across the belly, and down inside the left hip.  When you perform circular abdominal massage, you help stimulate your colon's normal wave pattern of movement called peristalsis.  It is most beneficial when done after eating.  Positioning You can practice abdominal massage with oil while lying down, or in the shower with soap.  Some people find that it is just as effective to do the massage through clothing while sitting or standing.  How to Massage Start by placing your finger tips or knuckles on your right side, just inside your hip bone.  . Make small circular movements while you move upward toward your rib cage.   . Once you reach the bottom right side of your rib cage, take your circular movements across to the left side of the bottom of your rib cage.  . Next, move downward until you reach the inside of your left hip bone.  This is the path your feces travel in your colon. . Continue to perform your abdominal massage in this pattern for 10 minutes each day.     You can apply as much pressure as is comfortable in your massage.  Start gently and build pressure as you continue to practice.  Notice any areas of pain as you massage; areas of slight pain may be relieved as you massage, but if you have areas of significant or intense pain, consult with your healthcare provider.  Other Considerations . General physical activity including bending and stretching can have a beneficial massage-like effect on the colon.  Deep breathing can also stimulate the colon because breathing deeply activates the same nervous system that supplies the colon.   . Abdominal massage should always be used in  combination with a bowel-conscious diet that is high in the proper type of fiber for you, fluids (primarily water), and a regular exercise program.  

## 2014-09-26 ENCOUNTER — Ambulatory Visit: Payer: Medicare Other | Admitting: Physical Therapy

## 2014-09-26 DIAGNOSIS — Z9221 Personal history of antineoplastic chemotherapy: Secondary | ICD-10-CM | POA: Diagnosis not present

## 2014-09-26 DIAGNOSIS — R293 Abnormal posture: Secondary | ICD-10-CM | POA: Diagnosis not present

## 2014-09-26 DIAGNOSIS — M436 Torticollis: Secondary | ICD-10-CM | POA: Diagnosis not present

## 2014-09-26 DIAGNOSIS — K5902 Outlet dysfunction constipation: Secondary | ICD-10-CM | POA: Diagnosis not present

## 2014-09-26 DIAGNOSIS — K219 Gastro-esophageal reflux disease without esophagitis: Secondary | ICD-10-CM | POA: Diagnosis not present

## 2014-09-26 DIAGNOSIS — C01 Malignant neoplasm of base of tongue: Secondary | ICD-10-CM | POA: Diagnosis not present

## 2014-09-26 NOTE — Therapy (Signed)
[name], [place], [address] Phone: [phone]   Fax:  [phone]  Physical Therapy Treatment  Patient Details  Name: Thomas Hendricks MRN: [id number] Date of Birth: January 29, 1951 Referring Provider:  Lavone Orn, MD  Encounter Date: 09/26/2014      PT End of Session - 09/26/14 1706    Visit Number 4   Number of Visits 9   Date for PT Re-Evaluation 11/16/14   PT Start Time 1602   PT Stop Time 1657   PT Time Calculation (min) 55 min   Activity Tolerance Patient tolerated treatment well   Behavior During Therapy [facility] for tasks assessed/performed      Past Medical History  Diagnosis Date  . 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 Allergic rhinitis   . Esophageal reflux   . Dysplastic skin lesion     Past Surgical History  Procedure Laterality Date  . Biopsy of  base of tongue Left 01/13/14  . Oral surgery tooth extraction    . Tonsillectomy    . Colonoscopy      There were no vitals filed for this visit.  Visit Diagnosis:  Stiffness of neck  Abnormal posture      Subjective Assessment - 09/26/14 1605    Subjective Has done the exercises some, but not as much as I should be.  Just mild soreness after last session.  Felt a little looser after last session.   Currently in Pain? Yes   Pain Score 4    Pain Location Shoulder   Pain Orientation Left   Pain Descriptors / Indicators Sore;Other (Comment)  not constant   Aggravating Factors  lifting it   Pain Relieving Factors rest                         [facility] Adult PT Treatment/Exercise - 09/26/14 0001    Shoulder Exercises: Supine   Other Supine Exercises supine over foam roll, PNF diagonals up and out with stretch in "V" position, slowly x 5   Manual Therapy   Myofascial Release crosshands technique at chest in horizontal direction; occipito-atlantal release followed by cervical traction  with concurrent sternal distraction; crosshands at head to upper trap insertion each side; upper extremity pulling supine to sidelying with scapular mobilization then (emphasis on depression and protraction), each side   Passive ROM neck in rotation and sidebend; chin retraction, then gentle extension, all to patient tolerance; some stroking of scalenes with sidebend stretch; pect minor stretches                  PT Short Term Goals - 09/21/14 1636    PT SHORT TERM GOAL #3   Title understand howt to perform abdominal massage to decrease constipation   Time 4   Period Weeks   Status Achieved           PT Long Term Goals - 09/21/14 1636    PT LONG TERM GOAL #1   Title straining to have a bowel movement decreased >/= 60%   Time 12   Period Weeks   Status On-going  no change due to stool softner           Long Term Clinic Goals - 09/26/14 1710    CC Long Term Goal  #1   Status On-going   CC Long Term Goal  #2   Status On-going  CC Long Term Goal  #3   Status On-going   CC Long Term Goal  #4   Status On-going            Plan - 09/26/14 1707    Clinical Impression Statement Pt. tolerates vigorous stretching well and relaxes well, but musculature at neck and upper back are very tight, including periscapular musculature.  Good release felt on left side of neck today. Pt. felt like stretches were intense but effective today.                                                         Pt will benefit from skilled therapeutic intervention in order to improve on the following deficits Decreased range of motion;Increased fascial restricitons;Postural dysfunction   Rehab Potential Good   PT Frequency 1x / week   PT Duration 6 weeks  for neck   PT Treatment/Interventions Therapeutic exercise;Manual techniques;Passive range of motion   PT Next Visit Plan continue manual stretching and myofascial release; remeasure ROM and assess goals   PT Home Exercise Plan Pt. to try  to stretch his neck more regularly   Consulted and Agree with Plan of Care Patient        Problem List Patient Active Problem List   Diagnosis Date Noted  . Malignant neoplasm of base of tongue 01/25/2014    [name] 09/26/2014, 5:11 PM  [place], [place], [address] Phone: [phone]   Fax:  [place], PT 09/26/2014 5:12 PM

## 2014-09-27 ENCOUNTER — Encounter: Payer: Self-pay | Admitting: Physical Therapy

## 2014-10-03 ENCOUNTER — Ambulatory Visit: Payer: Medicare Other | Admitting: Physical Therapy

## 2014-10-03 DIAGNOSIS — M436 Torticollis: Secondary | ICD-10-CM

## 2014-10-03 DIAGNOSIS — Z9221 Personal history of antineoplastic chemotherapy: Secondary | ICD-10-CM | POA: Diagnosis not present

## 2014-10-03 DIAGNOSIS — R293 Abnormal posture: Secondary | ICD-10-CM

## 2014-10-03 DIAGNOSIS — K5902 Outlet dysfunction constipation: Secondary | ICD-10-CM | POA: Diagnosis not present

## 2014-10-03 DIAGNOSIS — C01 Malignant neoplasm of base of tongue: Secondary | ICD-10-CM | POA: Diagnosis not present

## 2014-10-03 DIAGNOSIS — K219 Gastro-esophageal reflux disease without esophagitis: Secondary | ICD-10-CM | POA: Diagnosis not present

## 2014-10-03 NOTE — Therapy (Signed)
[place], [place], [address] Phone: [phone]   Fax:  [phone]  Physical Therapy Treatment  Patient Details  Name: Thomas Hendricks MRN: [id number] Date of Birth: [birth year] Referring Provider:  Kathreen Cosier, MD  Encounter Date: 10/03/2014      PT End of Session - 10/03/14 1721    Visit Number 5   Number of Visits 9   Date for PT Re-Evaluation 11/16/14   PT Start Time 7035   PT Stop Time 1520   PT Time Calculation (min) 44 min   Activity Tolerance Patient tolerated treatment well   Behavior During Therapy [facility] for tasks assessed/performed      Past Medical History  Diagnosis Date  . 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 Allergic rhinitis   . Esophageal reflux   . Dysplastic skin lesion     Past Surgical History  Procedure Laterality Date  . Biopsy of  base of tongue Left 01/13/14  . Oral surgery tooth extraction    . Tonsillectomy    . Colonoscopy      There were no vitals filed for this visit.  Visit Diagnosis:  Stiffness of neck  Abnormal posture      Subjective Assessment - 10/03/14 1435    Subjective Still not too much stretching done this past week.   Currently in Pain? No/denies            [facility] PT Assessment - 10/03/14 0001    ROM / Strength   AROM / PROM / Strength Strength   AROM   Cervical Flexion 1 finger chin to chest   Cervical Extension 10% loss   Cervical - Right Side Bend 75% loss   Cervical - Left Side Bend 75% loss   Cervical - Right Rotation 15% loss   Cervical - Left Rotation 15% loss                     [facility] Adult PT Treatment/Exercise - 10/03/14 0001    Neck Exercises: Supine   Cervical Isometrics Extension;5 secs;10 reps   Other Supine Exercise shoulder extension/scapular retracction isometrics 5 seconds x 10;    Other Supine Exercise shoulder horizontal abduction vs. blue Theraband x 10    Manual Therapy   Myofascial Release occipito-atlantal release followed by cervical traction with concurrent sternal distraction; crosshands at head to upper trap insertion each side; upper extremity pulling in sidelying with emphasis on shoulder depression with head tilted to opposite side   Passive ROM neck in rotation and sidebend; all to patient tolerance; some stroking of scalenes with sidebend stretch; pect minor stretches                PT Education - 10/03/14 1719    Education provided Yes   Education Details neck retraction isometrics, shoulder extension and scapular retraction isometrics, and shoulder horizontal abduction in supine with blue Theraband; emphasized need to focus on neck stretches, particularly in sidebending   Person(s) Educated Patient   Methods Explanation;Demonstration   Comprehension Verbalized understanding;Returned demonstration          PT Short Term Goals - 09/21/14 1636    PT SHORT TERM GOAL #3   Title understand howt to perform abdominal massage to decrease constipation   Time 4   Period Weeks   Status Achieved           PT Long Term Goals -  09/21/14 1636    PT LONG TERM GOAL #1   Title straining to have a bowel movement decreased >/= 60%   Time 12   Period Weeks   Status On-going  no change due to stool softner           Long Term Clinic Goals - 10/03/14 1438    CC Long Term Goal  #1   Status On-going   CC Long Term Goal  #4   Baseline 10% improvement 10/03/14   Status On-going            Plan - 10/03/14 1721    Clinical Impression Statement Patient responds well to manual stretching and does seem to achieve gains with sidebending of his neck with manual stretches; however, his follow-through has been poor and thus his neck AROM was not improved in sidebending when reassessed today.   Pt will benefit from skilled therapeutic intervention in order to improve on the following deficits Decreased range of  motion;Increased fascial restricitons;Postural dysfunction   Rehab Potential Good   PT Frequency 1x / week   PT Duration 6 weeks   PT Treatment/Interventions Therapeutic exercise;Manual techniques;Passive range of motion   PT Next Visit Plan check on HEP performance; continue manual stretching and myofascial release; reassess   PT Home Exercise Plan Pt. to try to stretch his neck more regularly; see education section for new strengthening HEP for neck and scapular retraction   Consulted and Agree with Plan of Care Patient        Problem List Patient Active Problem List   Diagnosis Date Noted  . Malignant neoplasm of base of tongue 01/25/2014    [place],DONNA 10/03/2014, 5:24 PM  [place], [place], [address] Phone: [phone]   Fax:  [place], PT 10/03/2014 5:25 PM

## 2014-10-03 NOTE — Patient Instructions (Signed)
Do daily.  Lie on your back on the bed or floor, no pillow.  1) With neck in neutral position (chin not jutting out), press the back of your head down into the mattress or floor as you exhale.  Hold approx. 5 seconds and do 10 reps.  2)  Same position, with arms at your sides and palms up, press arms into the mattress or floor and squeeze shoulder blades together as you exhale.  Do 10 reps.  3) On your back, one end of blue Theraband in each hand:  Start with arms up toward ceiling, then pull arms down to the mattress or floor, straight out to the sides, keeping elbows straight.  10-30 repetitions.

## 2014-10-05 ENCOUNTER — Ambulatory Visit: Payer: Medicare Other | Admitting: Physical Therapy

## 2014-10-05 DIAGNOSIS — M6289 Other specified disorders of muscle: Secondary | ICD-10-CM

## 2014-10-05 DIAGNOSIS — Z9221 Personal history of antineoplastic chemotherapy: Secondary | ICD-10-CM | POA: Diagnosis not present

## 2014-10-05 DIAGNOSIS — R293 Abnormal posture: Secondary | ICD-10-CM | POA: Diagnosis not present

## 2014-10-05 DIAGNOSIS — K219 Gastro-esophageal reflux disease without esophagitis: Secondary | ICD-10-CM | POA: Diagnosis not present

## 2014-10-05 DIAGNOSIS — M436 Torticollis: Secondary | ICD-10-CM | POA: Diagnosis not present

## 2014-10-05 DIAGNOSIS — K5902 Outlet dysfunction constipation: Secondary | ICD-10-CM | POA: Diagnosis not present

## 2014-10-05 DIAGNOSIS — C01 Malignant neoplasm of base of tongue: Secondary | ICD-10-CM | POA: Diagnosis not present

## 2014-10-05 NOTE — Patient Instructions (Signed)
About Abdominal Massage  Abdominal massage, also called external colon massage, is a self-treatment circular massage technique that can reduce and eliminate gas and ease constipation. The colon naturally contracts in waves in a clockwise direction starting from inside the right hip, moving up toward the ribs, across the belly, and down inside the left hip.  When you perform circular abdominal massage, you help stimulate your colon's normal wave pattern of movement called peristalsis.  It is most beneficial when done after eating.  Positioning You can practice abdominal massage with oil while lying down, or in the shower with soap.  Some people find that it is just as effective to do the massage through clothing while sitting or standing.  How to Massage Start by placing your finger tips or knuckles on your right side, just inside your hip bone.  . Make small circular movements while you move upward toward your rib cage.   . Once you reach the bottom right side of your rib cage, take your circular movements across to the left side of the bottom of your rib cage.  . Next, move downward until you reach the inside of your left hip bone.  This is the path your feces travel in your colon. . Continue to perform your abdominal massage in this pattern for 10 minutes each day.     You can apply as much pressure as is comfortable in your massage.  Start gently and build pressure as you continue to practice.  Notice any areas of pain as you massage; areas of slight pain may be relieved as you massage, but if you have areas of significant or intense pain, consult with your healthcare provider.  Other Considerations . General physical activity including bending and stretching can have a beneficial massage-like effect on the colon.  Deep breathing can also stimulate the colon because breathing deeply activates the same nervous system that supplies the colon.   . Abdominal massage should always be used in  combination with a bowel-conscious diet that is high in the proper type of fiber for you, fluids (primarily water), and a regular exercise program.  Quick Contraction: Gravity Resisted (Sitting)   Sitting, quickly squeeze then fully relax pelvic floor. Perform _5__ sets of _1__. Rest for ___ seconds between sets. Do __3_ times a day.  Copyright  VHI. All rights reserved.  Slow Contraction: Gravity Resisted (Sitting)   Sitting, slowly squeeze pelvic floor for _10__ seconds. Rest for __5_ seconds. Repeat _10__ times. Do _3__ times a day.  Toileting Techniques for Bowel Movements (Defecation) Using your belly (abdomen) and pelvic floor muscles to have a bowel movement is usually instinctive.  Sometimes people can have problems with these muscles and have to relearn proper defecation (emptying) techniques.  If you have weakness in your muscles, organs that are falling out, decreased sensation in your pelvis, or ignore your urge to go, you may find yourself straining to have a bowel movement.  You are straining if you are: . holding your breath or taking in a huge gulp of air and holding it  . keeping your lips and jaw tensed and closed tightly . turning red in the face because of excessive pushing or forcing . developing or worsening your  hemorrhoids . getting faint while pushing . not emptying completely and have to defecate many times a day  If you are straining, you are actually making it harder for yourself to have a bowel movement.  Many people find they are pulling up  with the pelvic floor muscles and closing off instead of opening the anus. Due to lack pelvic floor relaxation and coordination the abdominal muscles, one has to work harder to push the feces out.  Many people have never been taught how to defecate efficiently and effectively.  Notice what happens to your body when you are having a bowel movement.  While you are sitting on the toilet pay attention to the following  areas: . Jaw and mouth position . Angle of your hips   . Whether your feet touch the ground or not . Arm placement  . Spine position . Waist . Belly tension . Anus (opening of the anal canal)  An Evacuation/Defecation Plan   Here are the 4 basic points:  1. Lean forward enough for your elbows to rest on your knees 2. Support your feet on the floor or use a low stool if your feet don't touch the floor  3. Push out your belly as if you have swallowed a beach ball-you should feel a widening of your waist 4. Open and relax your pelvic floor muscles, rather than tightening around the anus      The following conditions my require modifications to your toileting posture:  . If you have had surgery in the past that limits your back, hip, pelvic, knee or ankle flexibility . Constipation   Your healthcare practitioner may make the following additional suggestions and adjustments:  1) Sit on the toilet  a) Make sure your feet are supported. b) Notice your hip angle and spine position-most people find it effective to lean forward or raise their knees, which can help the muscles around the anus to relax  c) When you lean forward, place your forearms on your thighs for support  2) Relax suggestions a) Breath deeply in through your nose and out slowly through your mouth as if you are smelling the flowers and blowing out the candles. b) To become aware of how to relax your muscles, contracting and releasing muscles can be helpful.  Pull your pelvic floor muscles in tightly by using the image of holding back gas, or closing around the anus (visualize making a circle smaller) and lifting the anus up and in.  Then release the muscles and your anus should drop down and feel open. Repeat 5 times ending with the feeling of relaxation. c) Keep your pelvic floor muscles relaxed; let your belly bulge out. d) The digestive tract starts at the mouth and ends at the anal opening, so be sure to relax both  ends of the tube.  Place your tongue on the roof of your mouth with your teeth separated.  This helps relax your mouth and will help to relax the anus at the same time.  3) Empty (defecation) a) Keep your pelvic floor and sphincter relaxed, then bulge your anal muscles.  Make the anal opening wide.  b) Stick your belly out as if you have swallowed a beach ball. c) Make your belly wall hard using your belly muscles while continuing to breathe. Doing this makes it easier to open your anus. d) Breath out and give a grunt (or try using other sounds such as ahhhh, shhhhh, ohhhh or grrrrrrr).  4) Finish a) As you finish your bowel movement, pull the pelvic floor muscles up and in.  This will leave your anus in the proper place rather than remaining pushed out and down. If you leave your anus pushed out and down, it will start to feel  as though that is normal and give you incorrect signals about needing to have a bowel movement.    Earlie Counts, PT [facility] [address] [place], [place] [address]  Copyright  VHI. All rights reserved.

## 2014-10-06 ENCOUNTER — Encounter: Payer: Self-pay | Admitting: Physical Therapy

## 2014-10-06 NOTE — Therapy (Signed)
[facility] [address], [place], [place], [address] Phone: [phone]   Fax:  [phone]  Physical Therapy Treatment  Patient Details  Name: Thomas Hendricks MRN: [id number] Date of Birth: 12-07-1950 Referring Provider:  Kathreen Cosier, MD  Encounter Date: 10/05/2014      PT End of Session - 10/06/14 0742    Visit Number 4   Date for PT Re-Evaluation 11/16/14   PT Start Time 1630   PT Stop Time 1705   PT Time Calculation (min) 35 min   Activity Tolerance Patient tolerated treatment well   Behavior During Therapy [facility] for tasks assessed/performed      Past Medical History  Diagnosis Date  . 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 Allergic rhinitis   . Esophageal reflux   . Dysplastic skin lesion     Past Surgical History  Procedure Laterality Date  . Biopsy of  base of tongue Left 01/13/14  . Oral surgery tooth extraction    . Tonsillectomy    . Colonoscopy      There were no vitals filed for this visit.  Visit Diagnosis:  PFD (pelvic floor dysfunction)      Subjective Assessment - 10/06/14 0739    Subjective Patient reports he is not sure about continuing therapy due to him not able to do HEP and understanding the role of what therapy can do for him. Patient reports the toileting technique is helping.    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  5 months since treatment completed.  PET in February showed no evidence of disease.  Lost 20 lbs. and has put back on about 8 lbs.  h/o back problems which are helped by Aleve and exercise daily.  Does core workout at least once a week.   Diagnostic tests Anorectal mamometry- showed weak sphinter, unable to expel the balloon, resting level 6uv,    Patient Stated Goals prevent constipation, not aggravate the hemrroids,    Currently in Pain?  No/denies      Physical therapy consisted of explaining how therapy can help his pelvic floor by assisting in pushing a bowel movement out and strengthening his pelvic floor.  Patient was educated on the anatomy of the pelvic floor and how not straining will assist in keeping the hemorrhoids down. Physical therapist reviewed HEP with patient and he verbally understand.  Patient was educated on pelvic floor contractions in the car so he can incorporate the exercises in his day.                            PT Education - 10/06/14 0741    Education provided Yes   Education Details Toileting technique, pelvic floor contraction, abdominal massage and how to relax the anal region for a bowel movement   Person(s) Educated Patient   Methods Explanation;Demonstration;Handout   Comprehension Returned demonstration;Verbalized understanding          PT Short Term Goals - 10/06/14 0743    PT SHORT TERM GOAL #1   Title understand how to have a bowel movement correctly while relaxing the pelvic floor   Time 4   Period Weeks   Status Achieved   PT SHORT TERM GOAL #2   Title ability to contract the pelvic floor without holding his breath  Time 4   Period Weeks   Status Achieved   PT SHORT TERM GOAL #3   Title understand howt to perform abdominal massage to decrease constipation   Time 4   Period Weeks   Status Achieved           PT Long Term Goals - 10/06/14 0743    PT LONG TERM GOAL #1   Title straining to have a bowel movement decreased >/= 60%   Time 12   Period Weeks   Status Achieved   PT LONG TERM GOAL #2   Title pelvic floor resting tone decreased </= 3uv   Time 12   Period Weeks   Status New  not assessed this week   PT LONG TERM GOAL #3   Title pelvic floor strength >/= 4/5   Time 12   Period Weeks   Status New  Not assessed this week   PT LONG TERM GOAL #4   Title ability to expel the anal sensor due to increased coordination of the pelvic  floor   Time 12   Period Weeks   Status New  has not been practicing at home           [place] - 10/03/14 1438    CC Long Term Goal  #1   Status On-going   CC Long Term Goal  #4   Baseline 10% improvement 10/03/14   Status On-going            Plan - 10/06/14 0745    Clinical Impression Statement Patient is a 64 year old male with pelvic floor weakness, difficulty to expel a bowel movement, hemorroids.  Patient is independent with his HEP but is is having difficulty finding time inhis day to perform them due to working 50 hours per week.  Patient is not comfortable using the rectal sensor so he is not using it at home to practice pushing out for his HEP.  Physical therapist explained what therapy is doing for him and how it is helping the hemmorroid not get worse.  Patient verbally understands.  Patient was given pelvic floor exercises he can do in his car  to make it easier to fit in his day.  Patient is able to strain less to have a bowel movement with using the toileting techniques. Patient has met all of his STG's.  Patient will be placed on hold until he sees Dr. Darrick Grinder on 10/18/2014 to determine if he can stop therapy or not.     Pt will benefit from skilled therapeutic intervention in order to improve on the following deficits Decreased strength;Decreased activity tolerance;Decreased endurance;Decreased coordination   Rehab Potential Good   PT Frequency 1x / week   PT Duration 8 weeks   PT Treatment/Interventions ADLs/Self Care Home Management;Functional mobility training;Therapeutic activities;Therapeutic exercise;Neuromuscular re-education;Manual techniques;Patient/family education   PT Next Visit Plan patient placed on hold until he sees the doctor on 10/18/2014   PT Home Exercise Plan current HEP   Consulted and Agree with Plan of Care Patient        Problem List Patient Active Problem List   Diagnosis Date Noted  . Malignant neoplasm of base of tongue  01/25/2014    GRAY,CHERYL,PT 10/06/2014, 7:54 AM  [facility] [address], [place], [place], [address] Phone: [phone]   Fax:  [phone]

## 2014-10-11 ENCOUNTER — Ambulatory Visit: Payer: Medicare Other | Attending: Gastroenterology

## 2014-10-11 DIAGNOSIS — M436 Torticollis: Secondary | ICD-10-CM | POA: Insufficient documentation

## 2014-10-11 DIAGNOSIS — R29898 Other symptoms and signs involving the musculoskeletal system: Secondary | ICD-10-CM | POA: Insufficient documentation

## 2014-10-11 DIAGNOSIS — R293 Abnormal posture: Secondary | ICD-10-CM | POA: Diagnosis not present

## 2014-10-11 NOTE — Therapy (Signed)
[place], [place], [address] Phone: [phone]   Fax:  [phone]  Physical Therapy Treatment  Patient Details  Name: Thomas Hendricks MRN: [id number] Date of Birth: [birth year] Referring Provider:  Lavone Orn, MD  Encounter Date: 10/11/2014      PT End of Session - 10/11/14 1701    Visit Number 5   Number of Visits 9   Date for PT Re-Evaluation 11/16/14   PT Start Time 1618   PT Stop Time 1702   PT Time Calculation (min) 44 min   Activity Tolerance Patient tolerated treatment well   Behavior During Therapy [facility] for tasks assessed/performed      Past Medical History  Diagnosis Date  . 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 Allergic rhinitis   . Esophageal reflux   . Dysplastic skin lesion     Past Surgical History  Procedure Laterality Date  . Biopsy of  base of tongue Left 01/13/14  . Oral surgery tooth extraction    . Tonsillectomy    . Colonoscopy      There were no vitals filed for this visit.  Visit Diagnosis:  Stiffness of neck  Abnormal posture  Muscular deconditioning      Subjective Assessment - 10/11/14 1619    Subjective The tightness in my neck seems some improved this week though I havent been doing my exercises much better. I did get a therapeutic massage last week and I think that helped some too.    Currently in Pain? No/denies            [facility] PT Assessment - 10/11/14 0001    AROM   Cervical Extension 10% loss   Cervical - Right Side Bend 75% loss   Cervical - Left Side Bend 75% loss   Cervical - Right Rotation 10%loss   Cervical - Left Rotation 15% loss                     [facility] Adult PT Treatment/Exercise - 10/11/14 0001    Manual Therapy   Myofascial Release cervical traction with concurrent sternal distraction; crosshands at head to upper trap insertion each side, trigger point release to bil upper trap  with passive side bend stretching   Passive ROM neck in rotation and sidebend with overpressure to upper trap of ipsilateral side, and suboccipital release and manual traction; all to pts tolerance                   PT Short Term Goals - 10/06/14 0743    PT SHORT TERM GOAL #1   Title understand how to have a bowel movement correctly while relaxing the pelvic floor   Time 4   Period Weeks   Status Achieved   PT SHORT TERM GOAL #2   Title ability to contract the pelvic floor without holding his breath   Time 4   Period Weeks   Status Achieved   PT SHORT TERM GOAL #3   Title understand howt to perform abdominal massage to decrease constipation   Time 4   Period Weeks   Status Achieved           PT Long Term Goals - 10/06/14 8546    PT LONG TERM GOAL #1   Title straining to have a bowel movement decreased >/= 60%   Time 12   Period Weeks   Status Achieved  PT LONG TERM GOAL #2   Title pelvic floor resting tone decreased </= 3uv   Time 12   Period Weeks   Status New  not assessed this week   PT LONG TERM GOAL #3   Title pelvic floor strength >/= 4/5   Time 12   Period Weeks   Status New  Not assessed this week   PT LONG TERM GOAL #4   Title ability to expel the anal sensor due to increased coordination of the pelvic floor   Time 12   Period Weeks   Status New  has not been practicing at home           [facility] - 10/11/14 1714    CC Long Term Goal  #1   Title Active neck sidebend limited not more than 50%.   Status On-going   CC Long Term Goal  #2   Title Independent with home exercise program for neck ROM and postural muscle strengthening.   Status On-going   CC Long Term Goal  #3   Title able to demonstrate erect neck posture and maintain for 5 minutes without cueing   Status On-going   CC Long Term Goal  #4   Title Pt. will report perception of at least 50% improved function and flexibility of neck.            Plan -  10/11/14 1705    Clinical Impression Statement Pt continues to tolerate stretching well and he reports that he can tell an improvement overall since starting treatment. Still has not been compliant much with HEP though has been tryin got do some stretches in his car. Feels therapy is beneficial and would like to continue per primary POC through June.    Pt will benefit from skilled therapeutic intervention in order to improve on the following deficits Decreased strength;Decreased activity tolerance;Decreased endurance;Decreased coordination   Rehab Potential Good   PT Frequency 1x / week   PT Duration 8 weeks   PT Treatment/Interventions ADLs/Self Care Home Management;Functional mobility training;Therapeutic activities;Therapeutic exercise;Neuromuscular re-education;Manual techniques;Patient/family education   PT Next Visit Plan Continue with manual therapy to increase cervical ROM and postural strength.    Consulted and Agree with Plan of Care Patient        Problem List Patient Active Problem List   Diagnosis Date Noted  . Malignant neoplasm of base of tongue 01/25/2014    Otelia Limes, PTA 10/11/2014, 5:15 PM  [place], [place], [address] Phone: [phone]   Fax:  [phone]

## 2014-10-12 ENCOUNTER — Encounter: Payer: Self-pay | Admitting: Physical Therapy

## 2014-10-18 ENCOUNTER — Ambulatory Visit: Payer: Medicare Other | Admitting: Physical Therapy

## 2014-10-18 DIAGNOSIS — K5900 Constipation, unspecified: Secondary | ICD-10-CM | POA: Diagnosis not present

## 2014-10-23 DIAGNOSIS — K117 Disturbances of salivary secretion: Secondary | ICD-10-CM | POA: Diagnosis not present

## 2014-10-23 DIAGNOSIS — M436 Torticollis: Secondary | ICD-10-CM | POA: Diagnosis not present

## 2014-10-23 DIAGNOSIS — E039 Hypothyroidism, unspecified: Secondary | ICD-10-CM | POA: Diagnosis not present

## 2014-10-23 DIAGNOSIS — I890 Lymphedema, not elsewhere classified: Secondary | ICD-10-CM | POA: Diagnosis not present

## 2014-10-23 DIAGNOSIS — R432 Parageusia: Secondary | ICD-10-CM | POA: Diagnosis not present

## 2014-10-23 DIAGNOSIS — Z6822 Body mass index (BMI) 22.0-22.9, adult: Secondary | ICD-10-CM | POA: Diagnosis not present

## 2014-10-23 DIAGNOSIS — C01 Malignant neoplasm of base of tongue: Secondary | ICD-10-CM | POA: Diagnosis not present

## 2014-10-25 ENCOUNTER — Ambulatory Visit: Payer: Medicare Other

## 2014-10-25 DIAGNOSIS — R293 Abnormal posture: Secondary | ICD-10-CM

## 2014-10-25 DIAGNOSIS — M436 Torticollis: Secondary | ICD-10-CM | POA: Diagnosis not present

## 2014-10-25 DIAGNOSIS — R29898 Other symptoms and signs involving the musculoskeletal system: Secondary | ICD-10-CM

## 2014-10-25 NOTE — Therapy (Signed)
[place], [place], [address] Phone: [phone]   Fax:  [phone]  Physical Therapy Treatment  Patient Details  Name: Thomas Hendricks MRN: [id number] Date of Birth: 1950/07/19 Referring Provider:  Lavone Orn, MD  Encounter Date: 10/25/2014      PT End of Session - 10/25/14 1656    Visit Number 6   Number of Visits 9   Date for PT Re-Evaluation 11/16/14   PT Start Time 1856   PT Stop Time 1656   PT Time Calculation (min) 46 min   Activity Tolerance Patient tolerated treatment well   Behavior During Therapy [facility] for tasks assessed/performed      Past Medical History  Diagnosis Date  . 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 Allergic rhinitis   . Esophageal reflux   . Dysplastic skin lesion     Past Surgical History  Procedure Laterality Date  . Biopsy of  base of tongue Left 01/13/14  . Oral surgery tooth extraction    . Tonsillectomy    . Colonoscopy      There were no vitals filed for this visit.  Visit Diagnosis:  Stiffness of neck  Abnormal posture  Muscular deconditioning      Subjective Assessment - 10/25/14 1611    Subjective Was diagnosed with hyperthyroid disease and will just have to take a pill to keep i tunder control. Havent been having any symptoms from that though. Neck feels about the same, doing my exercises some but not as much as I should.    Currently in Pain? No/denies            [facility] PT Assessment - 10/25/14 0001    AROM   Cervical Extension 5% loss   Cervical - Right Side Bend 60% loss   Cervical - Left Side Bend 75% loss   Cervical - Right Rotation 5% loss   Cervical - Left Rotation 10% loss                     [facility] Adult PT Treatment/Exercise - 10/25/14 0001    Manual Therapy   Myofascial Release cervical traction with concurrent sternal distraction; crosshands at head to upper trap insertion each  side, trigger point release to bil upper trap with passive side bend stretching   Passive ROM neck in rotation and sidebend with overpressure to upper trap of ipsilateral side, and suboccipital release and manual traction; all to pts tolerance                   PT Short Term Goals - 10/06/14 0743    PT SHORT TERM GOAL #1   Title understand how to have a bowel movement correctly while relaxing the pelvic floor   Time 4   Period Weeks   Status Achieved   PT SHORT TERM GOAL #2   Title ability to contract the pelvic floor without holding his breath   Time 4   Period Weeks   Status Achieved   PT SHORT TERM GOAL #3   Title understand howt to perform abdominal massage to decrease constipation   Time 4   Period Weeks   Status Achieved           PT Long Term Goals - 10/06/14 3149    PT LONG TERM GOAL #1   Title straining to have a bowel movement decreased >/= 60%   Time  12   Period Weeks   Status Achieved   PT LONG TERM GOAL #2   Title pelvic floor resting tone decreased </= 3uv   Time 12   Period Weeks   Status New  not assessed this week   PT LONG TERM GOAL #3   Title pelvic floor strength >/= 4/5   Time 12   Period Weeks   Status New  Not assessed this week   PT LONG TERM GOAL #4   Title ability to expel the anal sensor due to increased coordination of the pelvic floor   Time 12   Period Weeks   Status New  has not been practicing at home           [place] - 10/25/14 1704    CC Long Term Goal  #1   Title Active neck sidebend limited not more than 50%.  Rt side bend 60% limited today   Status On-going   CC Long Term Goal  #2   Title Independent with home exercise program for neck ROM and postural muscle strengthening.  Pt still reports not compliant with this   Status On-going   CC Long Term Goal  #3   Title able to demonstrate erect neck posture and maintain for 5 minutes without cueing   Status On-going   CC Long Term Goal  #4    Title Pt. will report perception of at least 50% improved function and flexibility of neck.   Status On-going            Plan - 10/25/14 1657    Clinical Impression Statement Some improvement with AROM this week as opposed to last week, and pt reports he can tell that by having to continuously stretch bil rotation while driving in the car to look over shoulder this has helped his motion. He reports will be incorporating bil side bend stretches into his day as well now as he is beginning to notice some improvement.    Pt will benefit from skilled therapeutic intervention in order to improve on the following deficits Decreased strength;Decreased activity tolerance;Decreased endurance;Decreased coordination   Rehab Potential Good   PT Frequency 1x / week   PT Duration 8 weeks   PT Treatment/Interventions ADLs/Self Care Home Management;Functional mobility training;Therapeutic activities;Therapeutic exercise;Neuromuscular re-education;Manual techniques;Patient/family education   PT Next Visit Plan Continue with manual therapy to increase cervical ROM and postural strength. Ask patient perceived rate of improvement thus far for goal assessment.    PT Home Exercise Plan current HEP   Consulted and Agree with Plan of Care Patient        Problem List Patient Active Problem List   Diagnosis Date Noted  . Malignant neoplasm of base of tongue 01/25/2014    Otelia Limes, PTA 10/25/2014, 5:07 PM  [place], [place], [address] Phone: [phone]   Fax:  [phone]

## 2014-10-31 ENCOUNTER — Ambulatory Visit: Payer: Medicare Other | Admitting: Physical Therapy

## 2014-10-31 DIAGNOSIS — R29898 Other symptoms and signs involving the musculoskeletal system: Secondary | ICD-10-CM

## 2014-10-31 DIAGNOSIS — M436 Torticollis: Secondary | ICD-10-CM | POA: Diagnosis not present

## 2014-10-31 DIAGNOSIS — R293 Abnormal posture: Secondary | ICD-10-CM

## 2014-10-31 NOTE — Therapy (Signed)
[place], [place], [address] Phone: [phone]   Fax:  [phone]  Physical Therapy Treatment  Patient Details  Name: Thomas Hendricks MRN: [id number] Date of Birth: [birth year] Referring Provider:  Lavone Orn, MD  Encounter Date: 10/31/2014      PT End of Session - 10/31/14 1659    Visit Number 7   Number of Visits 9   Date for PT Re-Evaluation 11/16/14   PT Start Time 9675   PT Stop Time 9163   PT Time Calculation (min) 48 min   Activity Tolerance Patient tolerated treatment well   Behavior During Therapy [facility] for tasks assessed/performed      Past Medical History  Diagnosis Date  . 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 Allergic rhinitis   . Esophageal reflux   . Dysplastic skin lesion     Past Surgical History  Procedure Laterality Date  . Biopsy of  base of tongue Left 01/13/14  . Oral surgery tooth extraction    . Tonsillectomy    . Colonoscopy      There were no vitals filed for this visit.  Visit Diagnosis:  Stiffness of neck  Abnormal posture  Muscular deconditioning      Subjective Assessment - 10/31/14 1600    Subjective Has been doing the exercise a little bit.  Taking the thyroid medicine now.   Currently in Pain? No/denies                         [facility] Adult PT Treatment/Exercise - 10/31/14 0001    Neck Exercises: Seated   Other Seated Exercise neck active rotation, sidebend, and extension in sitting   Manual Therapy   Myofascial Release occipito-atlantal release followed by cervical traction with concurrent sternal distraction; crosshands at head to upper trap insertion each side; upper extremity pulling in sidelying with emphasis on shoulder depression with head tilted to opposite side; crosshands technique horizontally across chest   Passive ROM neck in rotation and sidebend; all to patient tolerance; some  stroking of scalenes with sidebend stretch; pect minor stretches; chin retraction with patient's head extended over end of mat                  PT Short Term Goals - 10/06/14 0743    PT SHORT TERM GOAL #1   Title understand how to have a bowel movement correctly while relaxing the pelvic floor   Time 4   Period Weeks   Status Achieved   PT SHORT TERM GOAL #2   Title ability to contract the pelvic floor without holding his breath   Time 4   Period Weeks   Status Achieved   PT SHORT TERM GOAL #3   Title understand howt to perform abdominal massage to decrease constipation   Time 4   Period Weeks   Status Achieved           PT Long Term Goals - 10/06/14 8466    PT LONG TERM GOAL #1   Title straining to have a bowel movement decreased >/= 60%   Time 12   Period Weeks   Status Achieved   PT LONG TERM GOAL #2   Title pelvic floor resting tone decreased </= 3uv   Time 12   Period Weeks   Status New  not assessed this week   PT LONG TERM GOAL #3  Title pelvic floor strength >/= 4/5   Time 12   Period Weeks   Status New  Not assessed this week   PT LONG TERM GOAL #4   Title ability to expel the anal sensor due to increased coordination of the pelvic floor   Time 12   Period Weeks   Status New  has not been practicing at home           [place] - 10/31/14 [place] Term Goal  #1   Status On-going   CC Long Term Goal  #2   Status On-going   CC Long Term Goal  #3   Status On-going   CC Long Term Goal  #4   Status On-going            Plan - 10/31/14 1659    Clinical Impression Statement Patient reports that therapy was "pretty intense, but good" and that he feels that's what he needs.  Felt good after session.  Neck remains tight, but seems to loosen slightly during manual therapies.   Pt will benefit from skilled therapeutic intervention in order to improve on the following deficits Decreased range of motion;Postural  dysfunction   Rehab Potential Good   PT Frequency 1x / week   PT Duration 3 weeks   PT Treatment/Interventions Passive range of motion;Manual techniques   PT Next Visit Plan Continue with manual therapy to increase cervical ROM and postural strength. Ask patient perceived rate of improvement thus far for goal assessment.    PT Home Exercise Plan current HEP   Consulted and Agree with Plan of Care Patient        Problem List Patient Active Problem List   Diagnosis Date Noted  . Malignant neoplasm of base of tongue 01/25/2014    Reinhard Schack 10/31/2014, 5:03 PM  [place], [place], [address] Phone: [phone]   Fax:  [place], PT 10/31/2014 5:04 PM

## 2014-11-05 DIAGNOSIS — R300 Dysuria: Secondary | ICD-10-CM | POA: Diagnosis not present

## 2014-11-08 ENCOUNTER — Ambulatory Visit: Payer: Medicare Other

## 2014-11-08 DIAGNOSIS — R293 Abnormal posture: Secondary | ICD-10-CM | POA: Diagnosis not present

## 2014-11-08 DIAGNOSIS — M436 Torticollis: Secondary | ICD-10-CM | POA: Diagnosis not present

## 2014-11-08 DIAGNOSIS — R29898 Other symptoms and signs involving the musculoskeletal system: Secondary | ICD-10-CM

## 2014-11-08 NOTE — Therapy (Signed)
[place], [place], [address] Phone: [phone]   Fax:  [phone]  Physical Therapy Treatment  Patient Details  Name: Thomas Hendricks MRN: [id number] Date of Birth: 08/02/1950 Referring Provider:  Lavone Orn, MD  Encounter Date: 11/08/2014      PT End of Session - 11/08/14 1700    Visit Number 8   Number of Visits 9   Date for PT Re-Evaluation 11/16/14   PT Start Time 1610   PT Stop Time 1651   PT Time Calculation (min) 41 min   Activity Tolerance Patient tolerated treatment well   Behavior During Therapy [facility] for tasks assessed/performed      Past Medical History  Diagnosis Date  . 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 Allergic rhinitis   . Esophageal reflux   . Dysplastic skin lesion     Past Surgical History  Procedure Laterality Date  . Biopsy of  base of tongue Left 01/13/14  . Oral surgery tooth extraction    . Tonsillectomy    . Colonoscopy      There were no vitals filed for this visit.  Visit Diagnosis:  Stiffness of neck  Abnormal posture  Muscular deconditioning      Subjective Assessment - 11/08/14 1614    Subjective Started taking my new thyroid medicine and it hasnt bothered me. Doing my neck exercises some when I'm in the car which is often.    Currently in Pain? No/denies                         [facility] Adult PT Treatment/Exercise - 11/08/14 0001    Manual Therapy   Myofascial Release To bil chest cross hands technique horizontally; also cervical traction with concurrent sternal distraction   Passive ROM neck in rotation and sidebend and with side bend ipsilateral shoulder depression; all to patient tolerance; some soft tissue work to sternocleidomastoid with rotation and side bend stretches; pect minor stretches                  PT Short Term Goals - 10/06/14 0743    PT SHORT TERM GOAL #1   Title  understand how to have a bowel movement correctly while relaxing the pelvic floor   Time 4   Period Weeks   Status Achieved   PT SHORT TERM GOAL #2   Title ability to contract the pelvic floor without holding his breath   Time 4   Period Weeks   Status Achieved   PT SHORT TERM GOAL #3   Title understand howt to perform abdominal massage to decrease constipation   Time 4   Period Weeks   Status Achieved           PT Long Term Goals - 10/06/14 4315    PT LONG TERM GOAL #1   Title straining to have a bowel movement decreased >/= 60%   Time 12   Period Weeks   Status Achieved   PT LONG TERM GOAL #2   Title pelvic floor resting tone decreased </= 3uv   Time 12   Period Weeks   Status New  not assessed this week   PT LONG TERM GOAL #3   Title pelvic floor strength >/= 4/5   Time 12   Period Weeks   Status New  Not assessed this week   PT LONG TERM GOAL #  4   Title ability to expel the anal sensor due to increased coordination of the pelvic floor   Time 12   Period Weeks   Status New  has not been practicing at home           [place] - 11/08/14 1616    CC Long Term Goal  #1   Title Active neck sidebend limited not more than 50%.  Still 60% limited side bend today 11/08/14 though pt reports this feels looser.   Status On-going   CC Long Term Goal  #2   Title Independent with home exercise program for neck ROM and postural muscle strengthening.  Pt reports doing "some bettr" with this. 11/08/14   Status On-going   CC Long Term Goal  #3   Title able to demonstrate erect neck posture and maintain for 5 minutes without cueing  Pt feels he is able to do this now.   Status Achieved   CC Long Term Goal  #4   Title Pt. will report perception of at least 50% improved function and flexibility of neck.  Pt reports 10% improvement   Status On-going            Plan - 11/08/14 1701    Clinical Impression Statement Patient continues to report that  therapy right now is "what he needs". Before he started he was feeling like the tightness was progressively getting worse where as now he feels like he continues to improve after each session, and even though he isnt being fully compliant he can still see improvement, reporting at least 10% improvement with bil side bending which was his primary problem.    Pt will benefit from skilled therapeutic intervention in order to improve on the following deficits Decreased range of motion;Postural dysfunction   Rehab Potential Good   PT Frequency 1x / week   PT Duration 3 weeks   PT Treatment/Interventions Passive range of motion;Manual techniques   PT Next Visit Plan Continue with manual therapy to increase cervical ROM and postural strength. Renewal required in next 1-2 visits if pt to continue, though he would like to keep coming through July 1x/wk.   PT Home Exercise Plan current HEP   Consulted and Agree with Plan of Care Patient        Problem List Patient Active Problem List   Diagnosis Date Noted  . Malignant neoplasm of base of tongue 01/25/2014    Otelia Limes, PTA 11/08/2014, 5:08 PM  [place], [place], [address] Phone: [phone]   Fax:  [phone]

## 2014-11-09 NOTE — Therapy (Signed)
[facility] [address], [place], [place], [address] Phone: [phone]   Fax:  [phone]  Patient Details  Name: Thomas Hendricks MRN: [id number] Date of Birth: 05/10/1951 Referring Provider:  Kathreen Cosier, MD  Encounter Date: 10/05/2014 PHYSICAL THERAPY DISCHARGE SUMMARY  Visits from Start of Care: 4 Current functional level related to goals / functional outcomes: Patient last visit was on 10/05/2014.  Patient was placed on hold incase he wanted to return to therapy.  Patient understood ways to incorporate his exercises into his day.  He was having trouble due to his schedule.  Patient did not return to therapy after the last visit and did not schedule any. Patient did not meet any long term goals on his last visit.    Remaining deficits: Unable to assess due to patient not returning.    Education / Equipment: HEP Plan:                                                    Patient goals were not met. Patient is being discharged due to not returning since the last visit.  Thank you for the referral. ?????       Gilmore List,PT 11/09/2014, 12:29 PM  [facility] [address] [place], [place], [address] Phone: [phone]   Fax:  [phone]

## 2014-11-14 ENCOUNTER — Ambulatory Visit: Payer: Medicare Other | Attending: Radiology | Admitting: Physical Therapy

## 2014-11-14 DIAGNOSIS — M436 Torticollis: Secondary | ICD-10-CM

## 2014-11-14 DIAGNOSIS — R29898 Other symptoms and signs involving the musculoskeletal system: Secondary | ICD-10-CM | POA: Diagnosis not present

## 2014-11-14 DIAGNOSIS — R293 Abnormal posture: Secondary | ICD-10-CM | POA: Diagnosis not present

## 2014-11-14 NOTE — Patient Instructions (Signed)
Do rotation as well as sidebend stretches of neck.

## 2014-11-14 NOTE — Therapy (Signed)
[place], [place], [address] Phone: [phone]   Fax:  [phone]  Physical Therapy Treatment  Patient Details  Name: Thomas Hendricks MRN: [id number] Date of Birth: 17-Sep-1950 Referring Provider:  Lavone Orn, MD  Encounter Date: 11/14/2014      PT End of Session - 11/14/14 1709    Visit Number 9   Number of Visits 17   Date for PT Re-Evaluation 01/10/15   PT Start Time 1604   PT Stop Time 1651   PT Time Calculation (min) 47 min   Activity Tolerance Patient tolerated treatment well   Behavior During Therapy [facility] for tasks assessed/performed      Past Medical History  Diagnosis Date  . 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 Allergic rhinitis   . Esophageal reflux   . Dysplastic skin lesion     Past Surgical History  Procedure Laterality Date  . Biopsy of  base of tongue Left 01/13/14  . Oral surgery tooth extraction    . Tonsillectomy    . Colonoscopy      There were no vitals filed for this visit.  Visit Diagnosis:  Stiffness of neck - Plan: PT plan of care cert/re-cert      Subjective Assessment - 11/14/14 1607    Subjective "I've been trying and doing better."  It feels better.                         [facility] Adult PT Treatment/Exercise - 11/14/14 0001    Manual Therapy   Myofascial Release To bil chest cross hands technique horizontally; also cervical traction with concurrent sternal distraction;    Passive ROM neck in rotation and sidebend and with side bend ipsilateral shoulder depression; all to patient tolerance; some soft tissue work to sternocleidomastoid with rotation and side bend stretches; pect minor stretches                  PT Short Term Goals - 10/06/14 0743    PT SHORT TERM GOAL #1   Title understand how to have a bowel movement correctly while relaxing the pelvic floor   Time 4   Period Weeks   Status  Achieved   PT SHORT TERM GOAL #2   Title ability to contract the pelvic floor without holding his breath   Time 4   Period Weeks   Status Achieved   PT SHORT TERM GOAL #3   Title understand howt to perform abdominal massage to decrease constipation   Time 4   Period Weeks   Status Achieved           PT Long Term Goals - 10/06/14 7654    PT LONG TERM GOAL #1   Title straining to have a bowel movement decreased >/= 60%   Time 12   Period Weeks   Status Achieved   PT LONG TERM GOAL #2   Title pelvic floor resting tone decreased </= 3uv   Time 12   Period Weeks   Status New  not assessed this week   PT LONG TERM GOAL #3   Title pelvic floor strength >/= 4/5   Time 12   Period Weeks   Status New  Not assessed this week   PT LONG TERM GOAL #4   Title ability to expel the anal sensor due to increased coordination of the pelvic floor  Time 12   Period Weeks   Status New  has not been practicing at home           [place] - 11/14/14 1714    CC Long Term Goal  #1   Status On-going   CC Long Term Goal  #2   Baseline reports being more consistent recently with doing this, but still could be better   Status Partially Met   CC Long Term Goal  #4   Status On-going            Plan - 11/14/14 1710    Clinical Impression Statement Patient seemed to gain ROM with passive stretching and myofascial release of neck, though his AROM still looked limited following the session.  He feels like he is making gains but still needs to work more on home exercise program.                                         Pt will benefit from skilled therapeutic intervention in order to improve on the following deficits Decreased range of motion;Postural dysfunction   Rehab Potential Good   PT Frequency 1x / week   PT Duration 4 weeks  to 8 weeks if needed and beneficial   PT Treatment/Interventions Passive range of motion;Manual techniques   PT Next Visit Plan G-code next.  Continue with focus on manual therapy and encouraging patient to do HEP.   PT Home Exercise Plan current HEP   Consulted and Agree with Plan of Care Patient        Problem List Patient Active Problem List   Diagnosis Date Noted  . Malignant neoplasm of base of tongue 01/25/2014    [place],DONNA 11/14/2014, 5:18 PM  [place], [place], [address] Phone: [phone]   Fax:  [place], PT 11/14/2014 5:18 PM

## 2014-11-20 ENCOUNTER — Ambulatory Visit: Payer: Medicare Other | Admitting: Physical Therapy

## 2014-11-20 DIAGNOSIS — R293 Abnormal posture: Secondary | ICD-10-CM | POA: Diagnosis not present

## 2014-11-20 DIAGNOSIS — M436 Torticollis: Secondary | ICD-10-CM

## 2014-11-20 DIAGNOSIS — R29898 Other symptoms and signs involving the musculoskeletal system: Secondary | ICD-10-CM | POA: Diagnosis not present

## 2014-11-20 NOTE — Therapy (Signed)
[place], [place], [address] Phone: [phone]   Fax:  [phone]  Physical Therapy Treatment  Patient Details  Name: Thomas Hendricks MRN: [id number] Date of Birth: Nov 01, 1950 Referring Provider:  Aundra Millet, MD  Encounter Date: 11/20/2014      PT End of Session - 11/20/14 1714    Visit Number 10   Number of Visits 17   Date for PT Re-Evaluation 01/10/15   PT Start Time 4332   PT Stop Time 1655   PT Time Calculation (min) 50 min   Activity Tolerance Patient tolerated treatment well   Behavior During Therapy [facility] for tasks assessed/performed      Past Medical History  Diagnosis Date  . 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 Allergic rhinitis   . Esophageal reflux   . Dysplastic skin lesion     Past Surgical History  Procedure Laterality Date  . Biopsy of  base of tongue Left 01/13/14  . Oral surgery tooth extraction    . Tonsillectomy    . Colonoscopy      There were no vitals filed for this visit.  Visit Diagnosis:  Stiffness of neck  Abnormal posture      Subjective Assessment - 11/20/14 1605    Subjective Nothing new.  Have been doing some stretches--not as much as I should.   Currently in Pain? No/denies                         [facility] Adult PT Treatment/Exercise - 11/20/14 0001    Manual Therapy   Myofascial Release To bil chest cross hands technique horizontally, vertically, and diagonally; also O-A release, thencervical traction with concurrent sternal distraction;    Passive ROM neck in rotation and sidebend and with side bend ipsilateral shoulder depression; all to patient tolerance; some soft tissue work to sternocleidomastoid with rotation and side bend stretches; pect minor stretches; also in sidelying, shoulder depression with neck sidebend stretch in sidelying to each side                  PT Short Term  Goals - 10/06/14 0743    PT SHORT TERM GOAL #1   Title understand how to have a bowel movement correctly while relaxing the pelvic floor   Time 4   Period Weeks   Status Achieved   PT SHORT TERM GOAL #2   Title ability to contract the pelvic floor without holding his breath   Time 4   Period Weeks   Status Achieved   PT SHORT TERM GOAL #3   Title understand howt to perform abdominal massage to decrease constipation   Time 4   Period Weeks   Status Achieved           PT Long Term Goals - 10/06/14 9518    PT LONG TERM GOAL #1   Title straining to have a bowel movement decreased >/= 60%   Time 12   Period Weeks   Status Achieved   PT LONG TERM GOAL #2   Title pelvic floor resting tone decreased </= 3uv   Time 12   Period Weeks   Status New  not assessed this week   PT LONG TERM GOAL #3   Title pelvic floor strength >/= 4/5   Time 12   Period Weeks   Status New  Not assessed this week  PT LONG TERM GOAL #4   Title ability to expel the anal sensor due to increased coordination of the pelvic floor   Time 12   Period Weeks   Status New  has not been practicing at home           [place] - 2014/12/02 1718    CC Long Term Goal  #1   Status On-going   CC Long Term Goal  #2   Status Partially Met   CC Long Term Goal  #4   Status On-going            Plan - 02-Dec-2014 1714    Clinical Impression Statement In supine, passive neck sidebend appears to be at least 50% of full, but when patient sits up again, neck sidebend remains limited at least 60%.   Pt will benefit from skilled therapeutic intervention in order to improve on the following deficits Decreased range of motion;Postural dysfunction   Rehab Potential Good   PT Frequency 1x / week   PT Duration 6 weeks   PT Treatment/Interventions Passive range of motion;Manual techniques   PT Next Visit Plan Continue with focus on manual therapy and encouraging patient to do HEP.   PT Home Exercise  Plan current HEP   Consulted and Agree with Plan of Care Patient          [facility] - December 02, 2014 1719    Functional Assessment Tool Used clinical judgement   Functional Limitation Changing and maintaining body position   Changing and Maintaining Body Position Current Status [phone]) At least 40 percent but less than 60 percent impaired, limited or restricted   Changing and Maintaining Body Position Goal Status ([id number]) At least 20 percent but less than 40 percent impaired, limited or restricted      Problem List Patient Active Problem List   Diagnosis Date Noted  . Malignant neoplasm of base of tongue 01/25/2014    Daylyn Christine 2014/12/02, 5:21 PM  [place], [place], [address] Phone: [phone]   Fax:  [place], PT December 02, 2014 5:21 PM

## 2014-11-22 ENCOUNTER — Ambulatory Visit: Payer: Medicare Other | Admitting: Physical Therapy

## 2014-11-29 ENCOUNTER — Ambulatory Visit: Payer: Medicare Other

## 2014-11-29 DIAGNOSIS — R293 Abnormal posture: Secondary | ICD-10-CM | POA: Diagnosis not present

## 2014-11-29 DIAGNOSIS — M436 Torticollis: Secondary | ICD-10-CM

## 2014-11-29 DIAGNOSIS — R29898 Other symptoms and signs involving the musculoskeletal system: Secondary | ICD-10-CM | POA: Diagnosis not present

## 2014-11-29 NOTE — Therapy (Signed)
[place], [place], [address] Phone: [phone]   Fax:  [phone]  Physical Therapy Treatment  Patient Details  Name: Thomas Hendricks MRN: [id number] Date of Birth: July 15, 1950 Referring Provider:  Aundra Millet, MD  Encounter Date: 11/29/2014      PT End of Session - 11/29/14 1650    Visit Number 11   Number of Visits 17   Date for PT Re-Evaluation 01/10/15   PT Start Time 1602   PT Stop Time 1647   PT Time Calculation (min) 45 min   Activity Tolerance Patient tolerated treatment well   Behavior During Therapy [facility] for tasks assessed/performed      Past Medical History  Diagnosis Date  . 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 Allergic rhinitis   . Esophageal reflux   . Dysplastic skin lesion     Past Surgical History  Procedure Laterality Date  . Biopsy of  base of tongue Left 01/13/14  . Oral surgery tooth extraction    . Tonsillectomy    . Colonoscopy      There were no vitals filed for this visit.  Visit Diagnosis:  Stiffness of neck  Abnormal posture  Muscular deconditioning      Subjective Assessment - 11/29/14 1605    Subjective Been doing a little better with my stretches. Really feel like we are continuing to make slow progress. I don't notice the tightness all the time now like I used too.    Currently in Pain? No/denies            [facility] PT Assessment - 11/29/14 0001    AROM   Cervical - Right Side Bend 50% loss   Cervical - Left Side Bend 50% loss                     [facility] Adult PT Treatment/Exercise - 11/29/14 0001    Manual Therapy   Myofascial Release To bil chest cross hands technique horizontally   Passive ROM neck in rotation and sidebend and with side bend ipsilateral shoulder depression; all to patient tolerance; some soft tissue work to sternocleidomastoid with rotation and side bend stretches; pect minor  stretches                  PT Short Term Goals - 10/06/14 0743    PT SHORT TERM GOAL #1   Title understand how to have a bowel movement correctly while relaxing the pelvic floor   Time 4   Period Weeks   Status Achieved   PT SHORT TERM GOAL #2   Title ability to contract the pelvic floor without holding his breath   Time 4   Period Weeks   Status Achieved   PT SHORT TERM GOAL #3   Title understand howt to perform abdominal massage to decrease constipation   Time 4   Period Weeks   Status Achieved           PT Long Term Goals - 10/06/14 9357    PT LONG TERM GOAL #1   Title straining to have a bowel movement decreased >/= 60%   Time 12   Period Weeks   Status Achieved   PT LONG TERM GOAL #2   Title pelvic floor resting tone decreased </= 3uv   Time 12   Period Weeks   Status New  not assessed this week   PT  LONG TERM GOAL #3   Title pelvic floor strength >/= 4/5   Time 12   Period Weeks   Status New  Not assessed this week   PT LONG TERM GOAL #4   Title ability to expel the anal sensor due to increased coordination of the pelvic floor   Time 12   Period Weeks   Status New  has not been practicing at home           [place] - 11/29/14 1606    CC Long Term Goal  #1   Title Active neck sidebend limited not more than 50%.  Bil 50% limited and pt reports better motion felt   Status Achieved   CC Long Term Goal  #2   Title Independent with home exercise program for neck ROM and postural muscle strengthening.   Status Partially Met   CC Long Term Goal  #4   Title Pt. will report perception of at least 50% improved function and flexibility of neck.  30% improvement reported 11/29/14   Status Partially Met            Plan - 11/29/14 1651    Clinical Impression Statement Patient able to actively performbil side bend with 50% limitations today. Improvement with this and goal met. Patient seems to be doing better with compliance of  HEP as his tissue is not as tight with passive stretching as it has been with previous visits.    Pt will benefit from skilled therapeutic intervention in order to improve on the following deficits Decreased range of motion;Postural dysfunction   Rehab Potential Good   PT Frequency 1x / week   PT Duration 6 weeks   PT Treatment/Interventions Passive range of motion;Manual techniques   PT Next Visit Plan Continue with focus on manual therapy and encouraging patient to do HEP.   Consulted and Agree with Plan of Care Patient        Problem List Patient Active Problem List   Diagnosis Date Noted  . Malignant neoplasm of base of tongue 01/25/2014    Otelia Limes, PTA 11/29/2014, 4:57 PM  [place], [place], [address] Phone: [phone]   Fax:  [phone]

## 2014-12-06 ENCOUNTER — Ambulatory Visit: Payer: Medicare Other

## 2014-12-06 DIAGNOSIS — R29898 Other symptoms and signs involving the musculoskeletal system: Secondary | ICD-10-CM

## 2014-12-06 DIAGNOSIS — M436 Torticollis: Secondary | ICD-10-CM | POA: Diagnosis not present

## 2014-12-06 DIAGNOSIS — R293 Abnormal posture: Secondary | ICD-10-CM | POA: Diagnosis not present

## 2014-12-06 NOTE — Therapy (Signed)
[place], [place], [address] Phone: [phone]   Fax:  [phone]  Physical Therapy Treatment  Patient Details  Name: Thomas Hendricks MRN: [id number] Date of Birth: 1950-07-25 Referring Provider:  Lavone Orn, MD  Encounter Date: 12/06/2014      PT End of Session - 12/06/14 1649    Visit Number 12   Number of Visits 17   Date for PT Re-Evaluation 01/10/15   PT Start Time 1600   PT Stop Time 1646   PT Time Calculation (min) 46 min   Activity Tolerance Patient tolerated treatment well   Behavior During Therapy [facility] for tasks assessed/performed      Past Medical History  Diagnosis Date  . 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 Allergic rhinitis   . Esophageal reflux   . Dysplastic skin lesion     Past Surgical History  Procedure Laterality Date  . Biopsy of  base of tongue Left 01/13/14  . Oral surgery tooth extraction    . Tonsillectomy    . Colonoscopy      There were no vitals filed for this visit.  Visit Diagnosis:  Stiffness of neck  Abnormal posture  Muscular deconditioning      Subjective Assessment - 12/06/14 1602    Subjective Rode my bike for first time since all this started, was able to ride a trail 33 miles long and did fine. My neck feels a little sore today, but I've been extremely busy at work and that normally is what tightens me down.   Currently in Pain? No/denies                         [facility] Adult PT Treatment/Exercise - 12/06/14 0001    Manual Therapy   Passive ROM neck in rotation and sidebend and with side bend ipsilateral shoulder depression; all to patient tolerance; some soft tissue work to sternocleidomastoid with rotation and side bend stretches; pect minor stretches, and ended with using biotone for soft-deep tissue work to [employer] upper trap area with trigger point release                   PT  Short Term Goals - 10/06/14 0743    PT SHORT TERM GOAL #1   Title understand how to have a bowel movement correctly while relaxing the pelvic floor   Time 4   Period Weeks   Status Achieved   PT SHORT TERM GOAL #2   Title ability to contract the pelvic floor without holding his breath   Time 4   Period Weeks   Status Achieved   PT SHORT TERM GOAL #3   Title understand howt to perform abdominal massage to decrease constipation   Time 4   Period Weeks   Status Achieved           PT Long Term Goals - 10/06/14 3009    PT LONG TERM GOAL #1   Title straining to have a bowel movement decreased >/= 60%   Time 12   Period Weeks   Status Achieved   PT LONG TERM GOAL #2   Title pelvic floor resting tone decreased </= 3uv   Time 12   Period Weeks   Status New  not assessed this week   PT LONG TERM GOAL #3   Title pelvic floor strength >/= 4/5   Time  12   Period Weeks   Status New  Not assessed this week   PT LONG TERM GOAL #4   Title ability to expel the anal sensor due to increased coordination of the pelvic floor   Time 12   Period Weeks   Status New  has not been practicing at home           [place] - 12/06/14 1652    CC Long Term Goal  #2   Title Independent with home exercise program for neck ROM and postural muscle strengthening.   Status Partially Met   CC Long Term Goal  #4   Title Pt. will report perception of at least 50% improved function and flexibility of neck.  No difference from  last week as he feels increased soreness past few days with increased busyness at work.   Status Partially Met            Plan - 12/06/14 1650    Clinical Impression Statement Patient with some increased tightness noted today with passive stretching and he reports noticing some increased soreness with end range active stretching that he doesn't normally have though has been extra busy at work this week and rode 33 miles on his bike this weekend for first  time since being diagnosed. Patients PROMdid improve throughout session today.     Pt will benefit from skilled therapeutic intervention in order to improve on the following deficits Decreased range of motion;Postural dysfunction   Rehab Potential Good   PT Frequency 1x / week   PT Duration 6 weeks   PT Treatment/Interventions Passive range of motion;Manual techniques   PT Next Visit Plan Continue with focus on manual therapy and encouraging patient to do HEP.   PT Home Exercise Plan current HEP   Consulted and Agree with Plan of Care Patient        Problem List Patient Active Problem List   Diagnosis Date Noted  . Malignant neoplasm of base of tongue 01/25/2014    Otelia Limes, PTA 12/06/2014, 4:54 PM  [place], [place], [address] Phone: [phone]   Fax:  [phone]

## 2014-12-12 ENCOUNTER — Ambulatory Visit: Payer: Medicare Other | Attending: Radiology | Admitting: Physical Therapy

## 2014-12-12 DIAGNOSIS — R293 Abnormal posture: Secondary | ICD-10-CM | POA: Insufficient documentation

## 2014-12-12 DIAGNOSIS — M436 Torticollis: Secondary | ICD-10-CM | POA: Insufficient documentation

## 2014-12-12 DIAGNOSIS — R29898 Other symptoms and signs involving the musculoskeletal system: Secondary | ICD-10-CM | POA: Insufficient documentation

## 2014-12-12 NOTE — Therapy (Signed)
[place], [place], [address] Phone: [phone]   Fax:  [phone]  Physical Therapy Treatment  Patient Details  Name: Thomas Hendricks MRN: [id number] Date of Birth: 30-Jan-1951 Referring Provider:  Aundra Millet, MD  Encounter Date: 12/12/2014      PT End of Session - 12/12/14 1648    Visit Number 13   Number of Visits 17   Date for PT Re-Evaluation 01/10/15   PT Start Time 9326   PT Stop Time 1644   PT Time Calculation (min) 1434 min   Activity Tolerance Patient tolerated treatment well   Behavior During Therapy [facility] for tasks assessed/performed      Past Medical History  Diagnosis Date  . 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 Allergic rhinitis   . Esophageal reflux   . Dysplastic skin lesion     Past Surgical History  Procedure Laterality Date  . Biopsy of  base of tongue Left 01/13/14  . Oral surgery tooth extraction    . Tonsillectomy    . Colonoscopy      There were no vitals filed for this visit.  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.  5 months since treatment completed.  PET in February showed no evidence of disease.  Lost 20 lbs. and has put back on about 8 lbs.  h/o back problems which are helped by Aleve and exercise daily.  Does core workout at least once a week.   Currently in Pain? No/denies                         [facility] Adult PT Treatment/Exercise - 12/12/14 0001    Manual Therapy   Myofascial Release to paracervical muscles    Scapular Mobilization to each in sidelying with glides in all planes and inferior prolonged stretch   Passive  ROM in supine, rotation to each direction and then sidebending wtih ipsilateral shoulder depression.    Manual Traction to cervical area                  PT Short Term Goals - 10/06/14 0743    PT SHORT TERM GOAL #1   Title understand how to have a bowel movement correctly while relaxing the pelvic floor   Time 4   Period Weeks   Status Achieved   PT SHORT TERM GOAL #2   Title ability to contract the pelvic floor without holding his breath   Time 4   Period Weeks   Status Achieved   PT SHORT TERM GOAL #3   Title understand howt to perform abdominal massage to decrease constipation   Time 4   Period Weeks   Status Achieved           PT Long Term Goals - 10/06/14 7124    PT LONG TERM GOAL #1   Title straining to have a bowel movement decreased >/= 60%   Time 12   Period Weeks   Status Achieved   PT LONG TERM GOAL #2   Title pelvic floor resting tone decreased </=  3uv   Time 12   Period Weeks   Status New  not assessed this week   PT LONG TERM GOAL #3   Title pelvic floor strength >/= 4/5   Time 12   Period Weeks   Status New  Not assessed this week   PT LONG TERM GOAL #4   Title ability to expel the anal sensor due to increased coordination of the pelvic floor   Time 12   Period Weeks   Status New  has not been practicing at home           [place] - 12/06/14 1652    CC Long Term Goal  #2   Title Independent with home exercise program for neck ROM and postural muscle strengthening.   Status Partially Met   CC Long Term Goal  #4   Title Pt. will report perception of at least 50% improved function and flexibility of neck.  No difference from  last week as he feels increased soreness past few days with increased busyness at work.   Status Partially Met            Plan - 12/12/14 1700    Clinical Impression Statement pt reports he is feeling less neck stiffness overall so that means he is getting positive effect from the  treatment.    Pt will benefit from skilled therapeutic intervention in order to improve on the following deficits Decreased range of motion;Postural dysfunction   Rehab Potential Good   PT Frequency 1x / week   PT Duration 6 weeks   PT Treatment/Interventions Passive range of motion;Manual techniques   PT Next Visit Plan Continue with focus on manual therapy and encouraging patient to do HEP.   Consulted and Agree with Plan of Care Patient        Problem List Patient Active Problem List   Diagnosis Date Noted  . Malignant neoplasm of base of tongue 01/25/2014   Donato Heinz. Owens Shark, PT  12/12/2014, [place], [place], [address] Phone: [phone]   Fax:  [phone]

## 2014-12-13 DIAGNOSIS — R972 Elevated prostate specific antigen [PSA]: Secondary | ICD-10-CM | POA: Diagnosis not present

## 2014-12-13 DIAGNOSIS — Z6822 Body mass index (BMI) 22.0-22.9, adult: Secondary | ICD-10-CM | POA: Diagnosis not present

## 2014-12-20 ENCOUNTER — Ambulatory Visit: Payer: Medicare Other

## 2014-12-20 DIAGNOSIS — M436 Torticollis: Secondary | ICD-10-CM

## 2014-12-20 DIAGNOSIS — R293 Abnormal posture: Secondary | ICD-10-CM | POA: Diagnosis not present

## 2014-12-20 DIAGNOSIS — R29898 Other symptoms and signs involving the musculoskeletal system: Secondary | ICD-10-CM | POA: Diagnosis not present

## 2014-12-20 NOTE — Therapy (Signed)
[place], [place], [address] Phone: [phone]   Fax:  [phone]  Physical Therapy Treatment  Patient Details  Name: Thomas Hendricks MRN: [id number] Date of Birth: Mar 23, 1951 Referring Provider:  Aundra Millet, MD  Encounter Date: 12/20/2014      PT End of Session - 12/20/14 1656    Visit Number 14   Number of Visits 17   Date for PT Re-Evaluation 01/10/15   PT Start Time 1606   PT Stop Time 1651   PT Time Calculation (min) 45 min   Activity Tolerance Patient tolerated treatment well   Behavior During Therapy [facility] for tasks assessed/performed      Past Medical History  Diagnosis Date  . 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 Allergic rhinitis   . Esophageal reflux   . Dysplastic skin lesion     Past Surgical History  Procedure Laterality Date  . Biopsy of  base of tongue Left 01/13/14  . Oral surgery tooth extraction    . Tonsillectomy    . Colonoscopy      There were no vitals filed for this visit.  Visit Diagnosis:  Stiffness of neck  Abnormal posture  Muscular deconditioning      Subjective Assessment - 12/20/14 1611    Subjective My neck tightness is much improved since starting therapy and feel like it continues to get better.    Currently in Pain? No/denies            [facility] PT Assessment - 12/20/14 0001    AROM   Cervical - Right Side Bend 50% loss   Cervical - Left Side Bend 50% loss                     [facility] Adult PT Treatment/Exercise - 12/20/14 0001    Manual Therapy   Manual therapy comments Deep tissue work at sternocleidomastoid insertion where pt is palpably tightest; release felt with this today at end of session and after contrat/relax exercise   Myofascial Release to paracervical muscles    Passive ROM in supine, rotation to each direction and then focused on sidebending with ipsilateral shoulder  depression; contract/relax to Rt with Lt side bend stretching    Manual Traction to cervical area                  PT Short Term Goals - 10/06/14 0743    PT SHORT TERM GOAL #1   Title understand how to have a bowel movement correctly while relaxing the pelvic floor   Time 4   Period Weeks   Status Achieved   PT SHORT TERM GOAL #2   Title ability to contract the pelvic floor without holding his breath   Time 4   Period Weeks   Status Achieved   PT SHORT TERM GOAL #3   Title understand howt to perform abdominal massage to decrease constipation   Time 4   Period Weeks   Status Achieved           PT Long Term Goals - 10/06/14 1103    PT LONG TERM GOAL #1   Title straining to have a bowel movement decreased >/= 60%   Time 12   Period Weeks   Status Achieved   PT LONG TERM GOAL #2   Title pelvic floor resting tone decreased </= 3uv   Time 12   Period Weeks  Status New  not assessed this week   PT LONG TERM GOAL #3   Title pelvic floor strength >/= 4/5   Time 12   Period Weeks   Status New  Not assessed this week   PT LONG TERM GOAL #4   Title ability to expel the anal sensor due to increased coordination of the pelvic floor   Time 12   Period Weeks   Status New  has not been practicing at home           [facility] Term Clinic Goals - 12/20/14 1651    CC Long Term Goal  #4   Title Pt. will report perception of at least 50% improved function and flexibility of neck.  50% improvement reported 12/20/14   Status Achieved            Plan - 12/20/14 1657    Clinical Impression Statement Pt reports his perception of improvement is at 50%, goal met. His muscle tissue was palpably much softer today and release felt at Rt sternocleidomastoid insertion after deep tissue work and contract/relax today. Discussed with patient upcoming discharge as patient has done well, but recenetly improvements with AROM have maintained and after improvement felt after session  today, we agreed to end at end of the month continuing with 1x/wk.   Pt will benefit from skilled therapeutic intervention in order to improve on the following deficits Decreased range of motion;Postural dysfunction   Rehab Potential Good   PT Frequency 1x / week   PT Treatment/Interventions Passive range of motion;Manual techniques   PT Next Visit Plan Continue with focus on manual therapy and encouraging patient to do HEP.   PT Home Exercise Plan Being compliant with current HEP   Consulted and Agree with Plan of Care Patient        Problem List Patient Active Problem List   Diagnosis Date Noted  . Malignant neoplasm of base of tongue 01/25/2014    Rosenberger, Valerie Ann, PTA 12/20/2014, 5:05 PM  [facility] [address] [place], [place], [address] Phone: [phone]   Fax:  [phone]      

## 2014-12-26 ENCOUNTER — Ambulatory Visit: Payer: Medicare Other | Admitting: Physical Therapy

## 2014-12-26 DIAGNOSIS — R293 Abnormal posture: Secondary | ICD-10-CM | POA: Diagnosis not present

## 2014-12-26 DIAGNOSIS — R29898 Other symptoms and signs involving the musculoskeletal system: Secondary | ICD-10-CM | POA: Diagnosis not present

## 2014-12-26 DIAGNOSIS — M436 Torticollis: Secondary | ICD-10-CM

## 2014-12-26 NOTE — Therapy (Signed)
[place], [place], [address] Phone: [phone]   Fax:  [phone]  Physical Therapy Treatment  Patient Details  Name: Thomas Hendricks MRN: [id number] Date of Birth: Mar 07, 1951 Referring Provider:  Aundra Millet, MD  Encounter Date: 12/26/2014      PT End of Session - 12/26/14 1741    Visit Number 15   Number of Visits 17   Date for PT Re-Evaluation 01/10/15   PT Start Time 7673   PT Stop Time 1655   PT Time Calculation (min) 45 min   Activity Tolerance Patient tolerated treatment well   Behavior During Therapy [facility] for tasks assessed/performed      Past Medical History  Diagnosis Date  . 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 Allergic rhinitis   . Esophageal reflux   . Dysplastic skin lesion     Past Surgical History  Procedure Laterality Date  . Biopsy of  base of tongue Left 01/13/14  . Oral surgery tooth extraction    . Tonsillectomy    . Colonoscopy      There were no vitals filed for this visit.  Visit Diagnosis:  Stiffness of neck  Abnormal posture      Subjective Assessment - 12/26/14 1609    Subjective Knows it's better because he doesn't notice his neck all the time--doesn't notice the tightness.  Says he is doing some stretching exercises a few times a day.   Currently in Pain? No/denies                         [facility] Adult PT Treatment/Exercise - 12/26/14 0001    Manual Therapy   Manual Therapy Soft tissue mobilization   Soft tissue mobilization To lateral neck muscles with passive sidebend stretches.   Myofascial Release To lateral cervical muscles/upper traps; crosshands technique to chest in horizontal direction; O-A release.   Passive ROM Supine pect minor stretches; emphasis of session today on cervical sidebend and rotation passive stretches.   Manual Traction Cervical, with concurrent sternal distraction                   PT Short Term Goals - 10/06/14 0743    PT SHORT TERM GOAL #1   Title understand how to have a bowel movement correctly while relaxing the pelvic floor   Time 4   Period Weeks   Status Achieved   PT SHORT TERM GOAL #2   Title ability to contract the pelvic floor without holding his breath   Time 4   Period Weeks   Status Achieved   PT SHORT TERM GOAL #3   Title understand howt to perform abdominal massage to decrease constipation   Time 4   Period Weeks   Status Achieved           PT Long Term Goals - 10/06/14 4193    PT LONG TERM GOAL #1   Title straining to have a bowel movement decreased >/= 60%   Time 12   Period Weeks   Status Achieved   PT LONG TERM GOAL #2   Title pelvic floor resting tone decreased </= 3uv   Time 12   Period Weeks   Status New  not assessed this week   PT LONG TERM GOAL #3   Title pelvic floor strength >/= 4/5   Time 12   Period Weeks  Status New  Not assessed this week   PT LONG TERM GOAL #4   Title ability to expel the anal sensor due to increased coordination of the pelvic floor   Time 12   Period Weeks   Status New  has not been practicing at home           [place] - 12/20/14 1651    CC Long Term Goal  #4   Title Pt. will report perception of at least 50% improved function and flexibility of neck.  50% improvement reported 12/20/14   Status Achieved            Plan - 12/26/14 1742    Clinical Impression Statement Patient has continued once a week therapy but has not been seen by this therapist in many weeks.  It was noticeable today that he has made progress with neck flexibility, with visibly and palpably easier movement of his neck in sidebend in particular when PROM was performed.     Pt will benefit from skilled therapeutic intervention in order to improve on the following deficits Decreased range of motion;Postural dysfunction   Rehab Potential Good   PT Frequency 1x / week    PT Duration 6 weeks   PT Treatment/Interventions Passive range of motion;Manual techniques   PT Next Visit Plan Continue with focus on manual therapy and encouraging patient to do HEP.   Consulted and Agree with Plan of Care Patient        Problem List Patient Active Problem List   Diagnosis Date Noted  . Malignant neoplasm of base of tongue 01/25/2014    SALIS[place]Y,DONNA 12/26/2014, 5:44 PM  [place], [place], [address] Phone: [phone]   Fax:  Kelly, PT 12/26/2014 5:44 PM

## 2015-01-03 ENCOUNTER — Ambulatory Visit: Payer: Medicare Other

## 2015-01-03 DIAGNOSIS — R29898 Other symptoms and signs involving the musculoskeletal system: Secondary | ICD-10-CM | POA: Diagnosis not present

## 2015-01-03 DIAGNOSIS — Z923 Personal history of irradiation: Secondary | ICD-10-CM | POA: Diagnosis not present

## 2015-01-03 DIAGNOSIS — R293 Abnormal posture: Secondary | ICD-10-CM | POA: Diagnosis not present

## 2015-01-03 DIAGNOSIS — M436 Torticollis: Secondary | ICD-10-CM | POA: Diagnosis not present

## 2015-01-03 DIAGNOSIS — Z6822 Body mass index (BMI) 22.0-22.9, adult: Secondary | ICD-10-CM | POA: Diagnosis not present

## 2015-01-03 NOTE — Therapy (Signed)
[place], [place], [address] Phone: [phone]   Fax:  [phone]  Physical Therapy Treatment  Patient Details  Name: Thomas Hendricks MRN: [id number] Date of Birth: 15-Mar-1951 Referring Provider:  Aundra Millet, MD  Encounter Date: 01/03/2015      PT End of Session - 01/03/15 1644    Visit Number 16   Number of Visits 17   Date for PT Re-Evaluation 01/10/15   PT Start Time 4166   PT Stop Time 1639   PT Time Calculation (min) 48 min   Activity Tolerance Patient tolerated treatment well   Behavior During Therapy [facility] for tasks assessed/performed      Past Medical History  Diagnosis Date  . 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 Allergic rhinitis   . Esophageal reflux   . Dysplastic skin lesion     Past Surgical History  Procedure Laterality Date  . Biopsy of  base of tongue Left 01/13/14  . Oral surgery tooth extraction    . Tonsillectomy    . Colonoscopy      There were no vitals filed for this visit.  Visit Diagnosis:  Stiffness of neck  Abnormal posture  Muscular deconditioning      Subjective Assessment - 01/03/15 1642    Subjective Saw my ENT today for a check up and he said my throat is "clean as a whistle" and my neck looks really good with no fibrosis and the muscle tightness hasimproved. He was glad I had been doing therapy.    Currently in Pain? No/denies                         [facility] Adult PT Treatment/Exercise - 01/03/15 0001    Manual Therapy   Soft tissue mobilization To lateral neck muscles with passive sidebend stretches.   Myofascial Release To lateral cervical muscles/upper traps; crosshands technique to chest in horizontal direction   Passive ROM Supine pect minor stretches; emphasis of session today on cervical sidebend and rotation passive stretches.   Manual Traction Cervical, with concurrent sternal  distraction                  PT Short Term Goals - 10/06/14 0743    PT SHORT TERM GOAL #1   Title understand how to have a bowel movement correctly while relaxing the pelvic floor   Time 4   Period Weeks   Status Achieved   PT SHORT TERM GOAL #2   Title ability to contract the pelvic floor without holding his breath   Time 4   Period Weeks   Status Achieved   PT SHORT TERM GOAL #3   Title understand howt to perform abdominal massage to decrease constipation   Time 4   Period Weeks   Status Achieved           PT Long Term Goals - 10/06/14 0630    PT LONG TERM GOAL #1   Title straining to have a bowel movement decreased >/= 60%   Time 12   Period Weeks   Status Achieved   PT LONG TERM GOAL #2   Title pelvic floor resting tone decreased </= 3uv   Time 12   Period Weeks   Status New  not assessed this week   PT LONG TERM GOAL #3   Title pelvic floor strength >/= 4/5   Time  12   Period Weeks   Status New  Not assessed this week   PT LONG TERM GOAL #4   Title ability to expel the anal sensor due to increased coordination of the pelvic floor   Time 12   Period Weeks   Status New  has not been practicing at home           [place] - 01/03/15 1647    CC Long Term Goal  #1   Title Active neck sidebend limited not more than 50%.   Status Achieved   CC Long Term Goal  #2   Title Independent with home exercise program for neck ROM and postural muscle strengthening.   Status Partially Met   CC Long Term Goal  #3   Title able to demonstrate erect neck posture and maintain for 5 minutes without cueing   Status Achieved   CC Long Term Goal  #4   Title Pt. will report perception of at least 50% improved function and flexibility of neck.   Status Achieved            Plan - 01/03/15 1644    Clinical Impression Statement Pt and therapist continue to notice softer muscles with passive stretching though his AROM has seemed to plateau for  past few weeks. Possible it is time to discharge patient at htis time as he has seemingly reached maximum rehab potential at this time and pt is pleased with progress. PTA will speak to PT regarding this and PT sees pt for next treatment.    Pt will benefit from skilled therapeutic intervention in order to improve on the following deficits Decreased range of motion;Postural dysfunction   Rehab Potential Good   PT Frequency 1x / week   PT Duration 6 weeks   PT Treatment/Interventions Passive range of motion;Manual techniques   PT Next Visit Plan Continue with focus on manual therapy and encouraging patient to do HEP. Consider discharge next visit/possible candidate for dry needling???   PT Home Exercise Plan Being compliant with current HEP   Consulted and Agree with Plan of Care Patient        Problem List Patient Active Problem List   Diagnosis Date Noted  . Malignant neoplasm of base of tongue 01/25/2014    Otelia Limes, PTA 01/03/2015, 4:48 PM  [place], [place], [address] Phone: [phone]   Fax:  [phone]

## 2015-01-09 ENCOUNTER — Ambulatory Visit: Payer: Medicare Other | Admitting: Physical Therapy

## 2015-01-09 DIAGNOSIS — M436 Torticollis: Secondary | ICD-10-CM

## 2015-01-09 DIAGNOSIS — R293 Abnormal posture: Secondary | ICD-10-CM | POA: Diagnosis not present

## 2015-01-09 DIAGNOSIS — R29898 Other symptoms and signs involving the musculoskeletal system: Secondary | ICD-10-CM

## 2015-01-09 NOTE — Therapy (Signed)
[place], [place], [address] Phone: [phone]   Fax:  [phone]  Physical Therapy Treatment  Patient Details  Name: DEMETRICK EICHENBERGER MRN: [id number] Date of Birth: 1950/09/04 Referring Provider:  Aundra Millet, MD  Encounter Date: 01/09/2015      PT End of Session - 01/09/15 1715    Visit Number 17   Number of Visits 25   Date for PT Re-Evaluation 03/09/15   PT Start Time 1605   PT Stop Time 1651   PT Time Calculation (min) 46 min   Activity Tolerance Patient tolerated treatment well   Behavior During Therapy [facility] for tasks assessed/performed      Past Medical History  Diagnosis Date  . 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 Allergic rhinitis   . Esophageal reflux   . Dysplastic skin lesion     Past Surgical History  Procedure Laterality Date  . Biopsy of  base of tongue Left 01/13/14  . Oral surgery tooth extraction    . Tonsillectomy    . Colonoscopy      There were no vitals filed for this visit.  Visit Diagnosis:  Stiffness of neck - Plan: PT plan of care cert/re-cert  Muscular deconditioning - Plan: PT plan of care cert/re-cert      Subjective Assessment - 01/09/15 1605    Subjective Again reports about his good checkup recently in [facility], and that the ENT was all for his doing therapy.   Currently in Pain? No/denies  "just normal stiffness"                         [facility] Adult PT Treatment/Exercise - 01/09/15 0001    Shoulder Exercises: Sidelying   Other Sidelying Exercises In sidelying, D2 diagonal with arm while following the arm with his gaze; done 5x on each side.  Encouraged patient to do this at home.   Manual Therapy   Soft tissue mobilization To lateral neck muscles with passive sidebend stretches.   Myofascial Release To lateral cervical muscles/upper traps; crosshands technique to chest in horizontal  direction; O-A release.   Passive ROM Supine pect minor stretches; emphasis of session today on cervical sidebend and rotation passive stretches.   Manual Traction Cervical, with concurrent sternal distraction                  PT Short Term Goals - 10/06/14 0743    PT SHORT TERM GOAL #1   Title understand how to have a bowel movement correctly while relaxing the pelvic floor   Time 4   Period Weeks   Status Achieved   PT SHORT TERM GOAL #2   Title ability to contract the pelvic floor without holding his breath   Time 4   Period Weeks   Status Achieved   PT SHORT TERM GOAL #3   Title understand howt to perform abdominal massage to decrease constipation   Time 4   Period Weeks   Status Achieved           PT Long Term Goals - 10/06/14 6599    PT LONG TERM GOAL #1   Title straining to have a bowel movement decreased >/= 60%   Time 12   Period Weeks   Status Achieved   PT LONG TERM GOAL #2   Title pelvic floor resting tone decreased </= 3uv   Time  12   Period Weeks   Status New  not assessed this week   PT LONG TERM GOAL #3   Title pelvic floor strength >/= 4/5   Time 12   Period Weeks   Status New  Not assessed this week   PT LONG TERM GOAL #4   Title ability to expel the anal sensor due to increased coordination of the pelvic floor   Time 12   Period Weeks   Status New  has not been practicing at home           [place] - 01/09/15 [place] Term Goal  #2   Title Independent with home exercise program for neck ROM and postural muscle strengthening.   Status Partially Met   CC Long Term Goal  #5   Title Pt. will report perception of at least 65% improved function and flexibility of neck.   Time 4   Period Weeks   Status New            Plan - 01/09/15 1716    Clinical Impression Statement This PT and patient had a conversation about his progress.  Although slow, patient is showing improved passive stretching.  Plan to  continue once a week at this time.   Pt will benefit from skilled therapeutic intervention in order to improve on the following deficits Decreased range of motion;Increased fascial restricitons   Rehab Potential Good   PT Frequency 1x / week   PT Duration 4 weeks  to 8 weeks as needed   PT Treatment/Interventions Passive range of motion;Manual techniques;Therapeutic exercise   PT Next Visit Plan Continue with focus on manual techniques and encouraging HEP, including new active shoulder D2 in sidelying.   Consulted and Agree with Plan of Care Patient        Problem List Patient Active Problem List   Diagnosis Date Noted  . Malignant neoplasm of base of tongue 01/25/2014    SALISBURY,DONNA 01/09/2015, 5:25 PM  [place], [place], [address] Phone: [phone]   Fax:  [place], PT 01/09/2015 5:25 PM

## 2015-01-10 DIAGNOSIS — D224 Melanocytic nevi of scalp and neck: Secondary | ICD-10-CM | POA: Diagnosis not present

## 2015-01-10 DIAGNOSIS — L821 Other seborrheic keratosis: Secondary | ICD-10-CM | POA: Diagnosis not present

## 2015-01-10 DIAGNOSIS — D1801 Hemangioma of skin and subcutaneous tissue: Secondary | ICD-10-CM | POA: Diagnosis not present

## 2015-01-10 DIAGNOSIS — L738 Other specified follicular disorders: Secondary | ICD-10-CM | POA: Diagnosis not present

## 2015-01-10 DIAGNOSIS — D225 Melanocytic nevi of trunk: Secondary | ICD-10-CM | POA: Diagnosis not present

## 2015-01-10 DIAGNOSIS — D2262 Melanocytic nevi of left upper limb, including shoulder: Secondary | ICD-10-CM | POA: Diagnosis not present

## 2015-01-10 DIAGNOSIS — D2261 Melanocytic nevi of right upper limb, including shoulder: Secondary | ICD-10-CM | POA: Diagnosis not present

## 2015-01-10 DIAGNOSIS — L720 Epidermal cyst: Secondary | ICD-10-CM | POA: Diagnosis not present

## 2015-01-17 ENCOUNTER — Ambulatory Visit: Payer: Medicare Other

## 2015-01-21 DIAGNOSIS — Z23 Encounter for immunization: Secondary | ICD-10-CM | POA: Diagnosis not present

## 2015-01-23 ENCOUNTER — Ambulatory Visit: Payer: Medicare Other | Attending: Radiology | Admitting: Physical Therapy

## 2015-01-23 DIAGNOSIS — M436 Torticollis: Secondary | ICD-10-CM | POA: Insufficient documentation

## 2015-01-23 NOTE — Therapy (Signed)
[place], [place], [address] Phone: [phone]   Fax:  [phone]  Physical Therapy Treatment  Patient Details  Name: Thomas Hendricks MRN: [id number] Date of Birth: 1951/04/24 Referring Provider:  Aundra Millet, MD  Encounter Date: 01/23/2015      PT End of Session - 01/23/15 1714    Visit Number 18   Number of Visits 25   Date for PT Re-Evaluation 03/09/15   PT Start Time 2440   PT Stop Time 1650   PT Time Calculation (min) 43 min   Activity Tolerance Patient tolerated treatment well   Behavior During Therapy [facility] for tasks assessed/performed      Past Medical History  Diagnosis Date  . 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 Allergic rhinitis   . Esophageal reflux   . Dysplastic skin lesion     Past Surgical History  Procedure Laterality Date  . Biopsy of  base of tongue Left 01/13/14  . Oral surgery tooth extraction    . Tonsillectomy    . Colonoscopy      There were no vitals filed for this visit.  Visit Diagnosis:  Stiffness of neck      Subjective Assessment - 01/23/15 1607    Subjective Nothing new.  I got a flu shot a few days ago.     Currently in Pain? No/denies  still just stiffness            [facility] PT Assessment - 01/23/15 0001    AROM   Cervical - Right Side Bend 15 degrees  18 degrees once   Cervical - Left Side Bend 20 degrees   Cervical - Right Rotation 62   Cervical - Left Rotation 60  standard goniometer for these today                     [facility] Adult PT Treatment/Exercise - 01/23/15 0001    Manual Therapy   Soft tissue mobilization To lateral neck muscles with passive sidebend stretches.   Myofascial Release To lateral cervical muscles/upper traps; crosshands technique to chest in horizontal direction; O-A release.   Passive ROM Supine pect minor stretches; emphasis of session today on cervical sidebend  and rotation passive stretches.   Manual Traction Cervical, with concurrent sternal distraction                  PT Short Term Goals - 10/06/14 0743    PT SHORT TERM GOAL #1   Title understand how to have a bowel movement correctly while relaxing the pelvic floor   Time 4   Period Weeks   Status Achieved   PT SHORT TERM GOAL #2   Title ability to contract the pelvic floor without holding his breath   Time 4   Period Weeks   Status Achieved   PT SHORT TERM GOAL #3   Title understand howt to perform abdominal massage to decrease constipation   Time 4   Period Weeks   Status Achieved           PT Long Term Goals - 10/06/14 1027    PT LONG TERM GOAL #1   Title straining to have a bowel movement decreased >/= 60%   Time 12   Period Weeks   Status Achieved   PT LONG TERM GOAL #2   Title pelvic floor resting tone decreased </= 3uv   Time  12   Period Weeks   Status New  not assessed this week   PT LONG TERM GOAL #3   Title pelvic floor strength >/= 4/5   Time 12   Period Weeks   Status New  Not assessed this week   PT LONG TERM GOAL #4   Title ability to expel the anal sensor due to increased coordination of the pelvic floor   Time 12   Period Weeks   Status New  has not been practicing at home           [place] - 01/09/15 [place] Term Goal  #2   Title Independent with home exercise program for neck ROM and postural muscle strengthening.   Status Partially Met   CC Long Term Goal  #5   Title Pt. will report perception of at least 65% improved function and flexibility of neck.   Time 4   Period Weeks   Status New            Plan - 01/23/15 1715    Clinical Impression Statement Patient continues to note benefit of physical therapy.  Although still with neck stiffness, the neck is no longer uncomfortable when he moves it.  He certainly shows greater ROM passively than actively.     Pt will benefit from skilled  therapeutic intervention in order to improve on the following deficits Decreased range of motion;Increased fascial restricitons   Rehab Potential Good   PT Frequency 1x / week   PT Duration 4 weeks  to 8 weeks as needed   PT Treatment/Interventions Passive range of motion;Manual techniques;Therapeutic exercise   Consulted and Agree with Plan of Care Patient        Problem List Patient Active Problem List   Diagnosis Date Noted  . Malignant neoplasm of base of tongue 01/25/2014    [name] 01/23/2015, 5:17 PM  [place], [place], [address] Phone: [phone]   Fax:  [place], PT 01/23/2015 5:17 PM

## 2015-01-30 ENCOUNTER — Ambulatory Visit: Payer: Medicare Other | Admitting: Physical Therapy

## 2015-01-30 DIAGNOSIS — M436 Torticollis: Secondary | ICD-10-CM

## 2015-01-30 NOTE — Therapy (Signed)
[place], [place], [address] Phone: [phone]   Fax:  [phone]  Physical Therapy Treatment  Patient Details  Name: Thomas Hendricks MRN: [id number] Date of Birth: 09-May-1951 Referring Provider:  Aundra Millet, MD  Encounter Date: 01/30/2015      PT End of Session - 01/30/15 1703    Visit Number 19   Number of Visits 25   Date for PT Re-Evaluation 03/09/15   PT Start Time 1604   PT Stop Time 1650   PT Time Calculation (min) 46 min   Activity Tolerance Patient tolerated treatment well   Behavior During Therapy [facility] for tasks assessed/performed      Past Medical History  Diagnosis Date  . 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 Allergic rhinitis   . Esophageal reflux   . Dysplastic skin lesion     Past Surgical History  Procedure Laterality Date  . Biopsy of  base of tongue Left 01/13/14  . Oral surgery tooth extraction    . Tonsillectomy    . Colonoscopy      There were no vitals filed for this visit.  Visit Diagnosis:  Stiffness of neck      Subjective Assessment - 01/30/15 1605    Subjective "I still could be exercising more than I do.  I saw my massage therapist yesterday, but she does different things than you do."   Currently in Pain? No/denies  just tightness                         [facility] Adult PT Treatment/Exercise - 01/30/15 0001    Manual Therapy   Manual Therapy Joint mobilization   Joint Mobilization cervical, for extension and rotation motions   Soft tissue mobilization To lateral neck muscles with passive sidebend stretches.   Myofascial Release To lateral cervical muscles/upper traps; crosshands technique to chest in horizontal direction; O-A release.   Passive ROM Supine pect minor stretches; emphasis of session today on cervical sidebend and rotation passive stretches; also neck flexion passively, and stretches of  levator scapulae.   Manual Traction Cervical, with concurrent sternal distraction                  PT Short Term Goals - 10/06/14 0743    PT SHORT TERM GOAL #1   Title understand how to have a bowel movement correctly while relaxing the pelvic floor   Time 4   Period Weeks   Status Achieved   PT SHORT TERM GOAL #2   Title ability to contract the pelvic floor without holding his breath   Time 4   Period Weeks   Status Achieved   PT SHORT TERM GOAL #3   Title understand howt to perform abdominal massage to decrease constipation   Time 4   Period Weeks   Status Achieved           PT Long Term Goals - 10/06/14 2595    PT LONG TERM GOAL #1   Title straining to have a bowel movement decreased >/= 60%   Time 12   Period Weeks   Status Achieved   PT LONG TERM GOAL #2   Title pelvic floor resting tone decreased </= 3uv   Time 12   Period Weeks   Status New  not assessed this week   PT LONG TERM GOAL #3   Title pelvic  floor strength >/= 4/5   Time 12   Period Weeks   Status New  Not assessed this week   PT LONG TERM GOAL #4   Title ability to expel the anal sensor due to increased coordination of the pelvic floor   Time 12   Period Weeks   Status New  has not been practicing at home           [place] - 01/30/15 1706    [place] Term Goal  #2   Title Independent with home exercise program for neck ROM and postural muscle strengthening.   Status Partially Met   CC Long Term Goal  #5   Title Pt. will report perception of at least 65% improved function and flexibility of neck.   Status On-going            Plan - 01/30/15 1705    Clinical Impression Statement Ongoing tightness that improves with manual techniques in therapy.     Pt will benefit from skilled therapeutic intervention in order to improve on the following deficits Decreased range of motion;Increased fascial restricitons   Rehab Potential Good   PT Frequency 1x / week    PT Duration 4 weeks   PT Treatment/Interventions Passive range of motion;Manual techniques   PT Next Visit Plan Continue manual stretches and encouraging patient to do HEP.   Consulted and Agree with Plan of Care Patient        Problem List Patient Active Problem List   Diagnosis Date Noted  . Malignant neoplasm of base of tongue 01/25/2014    Xiara Knisley 01/30/2015, 5:08 PM  [place], [place], [address] Phone: [phone]   Fax:  [place], PT 01/30/2015 5:08 PM

## 2015-02-07 ENCOUNTER — Ambulatory Visit: Payer: Medicare Other | Admitting: Physical Therapy

## 2015-02-07 DIAGNOSIS — M436 Torticollis: Secondary | ICD-10-CM | POA: Diagnosis not present

## 2015-02-07 NOTE — Therapy (Signed)
[place], [place], [address] Phone: [phone]   Fax:  [phone]  Physical Therapy Treatment  Patient Details  Name: Thomas Hendricks MRN: [id number] Date of Birth: Jul 25, 1950 Referring Provider:  Lavone Orn, MD  Encounter Date: 02/07/2015      PT End of Session - 02/07/15 2016    Visit Number 20   Number of Visits 25   Date for PT Re-Evaluation 03/09/15   PT Start Time 4034   PT Stop Time 1657   PT Time Calculation (min) 47 min   Activity Tolerance Patient tolerated treatment well   Behavior During Therapy [facility] for tasks assessed/performed      Past Medical History  Diagnosis Date  . 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 Allergic rhinitis   . Esophageal reflux   . Dysplastic skin lesion     Past Surgical History  Procedure Laterality Date  . Biopsy of  base of tongue Left 01/13/14  . Oral surgery tooth extraction    . Tonsillectomy    . Colonoscopy      There were no vitals filed for this visit.  Visit Diagnosis:  Stiffness of neck      Subjective Assessment - 02/07/15 1611    Subjective "This is my last scheduled visit.  I think I'll take a break after that and see how I do; I think I'm comfortable with that."   Currently in Pain? No/denies            [facility] PT Assessment - 02/07/15 0001    AROM   Cervical - Right Side Bend 17   Cervical - Left Side Bend 23   Cervical - Right Rotation 64   Cervical - Left Rotation 60  standard goniometer for these today                     [facility] Adult PT Treatment/Exercise - 02/07/15 0001    Manual Therapy   Joint Mobilization cervical, for extension and rotation motions   Soft tissue mobilization To lateral neck muscles with passive sidebend stretches.   Myofascial Release To lateral cervical muscles/upper traps; crosshands technique to chest in horizontal direction; O-A release.   Passive ROM Supine pect minor stretches; emphasis of session today on cervical sidebend and rotation passive stretches.   Manual Traction Cervical, with concurrent sternal distraction                  PT Short Term Goals - 10/06/14 0743    PT SHORT TERM GOAL #1   Title understand how to have a bowel movement correctly while relaxing the pelvic floor   Time 4   Period Weeks   Status Achieved   PT SHORT TERM GOAL #2   Title ability to contract the pelvic floor without holding his breath   Time 4   Period Weeks   Status Achieved   PT SHORT TERM GOAL #3   Title understand howt to perform abdominal massage to decrease constipation   Time 4   Period Weeks   Status Achieved           PT Long Term Goals - 10/06/14 7425    PT LONG TERM GOAL #1   Title straining to have a bowel movement decreased >/= 60%   Time 12   Period Weeks   Status Achieved   PT LONG TERM GOAL #2  Title pelvic floor resting tone decreased </= 3uv   Time 12   Period Weeks   Status New  not assessed this week   PT LONG TERM GOAL #3   Title pelvic floor strength >/= 4/5   Time 12   Period Weeks   Status New  Not assessed this week   PT LONG TERM GOAL #4   Title ability to expel the anal sensor due to increased coordination of the pelvic floor   Time 12   Period Weeks   Status New  has not been practicing at home           [place] - 03-02-15 1647    CC Long Term Goal  #1   Title Active neck sidebend limited not more than 50%.   Status Achieved   CC Long Term Goal  #2   Title Independent with home exercise program for neck ROM and postural muscle strengthening.   Status Achieved   CC Long Term Goal  #3   Title able to demonstrate erect neck posture and maintain for 5 minutes without cueing   Status Achieved   CC Long Term Goal  #4   Title Pt. will report perception of at least 50% improved function and flexibility of neck.   Status Achieved   CC Long Term Goal  #5    Title Pt. will report perception of at least 65% improved function and flexibility of neck.   Baseline 50% as of 03/02/2015   Status Partially Met            Plan - 03/02/15 2017    Clinical Impression Statement Patient is doing well and feels ready for discharge, but wants to keep open the possibility of returning in the future if he should need to.  He has reached most of his goals.   Pt will benefit from skilled therapeutic intervention in order to improve on the following deficits Decreased range of motion;Increased fascial restricitons   Rehab Potential Good   PT Treatment/Interventions Passive range of motion;Manual techniques   PT Next Visit Plan None; discharge visit.   PT Home Exercise Plan Being compliant with current HEP   Consulted and Agree with Plan of Care Patient          G-Codes - 2015-03-02 2020    Functional Assessment Tool Used clinical judgement   Functional Limitation Changing and maintaining body position   Changing and Maintaining Body Position Goal Status [phone]) At least 20 percent but less than 40 percent impaired, limited or restricted   Changing and Maintaining Body Position Discharge Status ([id number]) At least 20 percent but less than 40 percent impaired, limited or restricted      Problem List Patient Active Problem List   Diagnosis Date Noted  . Malignant neoplasm of base of tongue 01/25/2014    SALISBURY,DONNA 02-Mar-2015, 8:22 PM  [place], [place], [address] Phone: [phone]   Fax:  [phone]         [facility] PT Assessment - 03/02/2015 0001    AROM   Cervical - Right Side Bend 17   Cervical - Left Side Bend 23   Cervical - Right Rotation 64   Cervical - Left Rotation 60  standard goniometer for these today     PHYSICAL THERAPY DISCHARGE SUMMARY  Visits from Start of Care: 20  Current functional level related to goals / functional outcomes: 4 of 5  goals met  as noted above.   Remaining deficits: Ongoing neck tightness that will benefit from patient doing his home exercise program.   Education / Equipment: Home exercise program for neck stretching. Plan: Patient agrees to discharge.  Patient goals were partially met. Patient is being discharged due to meeting the stated rehab goals.  ?????   Serafina Royals, PT 02/07/2015 8:27 PM

## 2015-02-15 DIAGNOSIS — Z1389 Encounter for screening for other disorder: Secondary | ICD-10-CM | POA: Diagnosis not present

## 2015-02-15 DIAGNOSIS — Z0000 Encounter for general adult medical examination without abnormal findings: Secondary | ICD-10-CM | POA: Diagnosis not present

## 2015-02-15 DIAGNOSIS — Z23 Encounter for immunization: Secondary | ICD-10-CM | POA: Diagnosis not present

## 2015-02-27 DIAGNOSIS — C01 Malignant neoplasm of base of tongue: Secondary | ICD-10-CM | POA: Diagnosis not present

## 2015-02-27 DIAGNOSIS — K117 Disturbances of salivary secretion: Secondary | ICD-10-CM | POA: Diagnosis not present

## 2015-02-27 DIAGNOSIS — M436 Torticollis: Secondary | ICD-10-CM | POA: Diagnosis not present

## 2015-02-27 DIAGNOSIS — E039 Hypothyroidism, unspecified: Secondary | ICD-10-CM | POA: Diagnosis not present

## 2015-02-27 DIAGNOSIS — R432 Parageusia: Secondary | ICD-10-CM | POA: Diagnosis not present

## 2015-02-27 DIAGNOSIS — Z6822 Body mass index (BMI) 22.0-22.9, adult: Secondary | ICD-10-CM | POA: Diagnosis not present

## 2015-02-27 DIAGNOSIS — I890 Lymphedema, not elsewhere classified: Secondary | ICD-10-CM | POA: Diagnosis not present

## 2015-04-13 DIAGNOSIS — H40012 Open angle with borderline findings, low risk, left eye: Secondary | ICD-10-CM | POA: Diagnosis not present

## 2015-05-29 DIAGNOSIS — H40012 Open angle with borderline findings, low risk, left eye: Secondary | ICD-10-CM | POA: Diagnosis not present

## 2015-05-29 DIAGNOSIS — H40011 Open angle with borderline findings, low risk, right eye: Secondary | ICD-10-CM | POA: Diagnosis not present

## 2015-06-06 DIAGNOSIS — R946 Abnormal results of thyroid function studies: Secondary | ICD-10-CM | POA: Diagnosis not present

## 2015-06-06 DIAGNOSIS — Z85810 Personal history of malignant neoplasm of tongue: Secondary | ICD-10-CM | POA: Diagnosis not present

## 2015-06-06 DIAGNOSIS — E079 Disorder of thyroid, unspecified: Secondary | ICD-10-CM | POA: Diagnosis not present

## 2015-06-06 DIAGNOSIS — C01 Malignant neoplasm of base of tongue: Secondary | ICD-10-CM | POA: Diagnosis not present

## 2015-06-06 DIAGNOSIS — Z6823 Body mass index (BMI) 23.0-23.9, adult: Secondary | ICD-10-CM | POA: Diagnosis not present

## 2015-07-17 DIAGNOSIS — L723 Sebaceous cyst: Secondary | ICD-10-CM | POA: Diagnosis not present

## 2015-07-17 DIAGNOSIS — D2239 Melanocytic nevi of other parts of face: Secondary | ICD-10-CM | POA: Diagnosis not present

## 2015-07-17 DIAGNOSIS — L738 Other specified follicular disorders: Secondary | ICD-10-CM | POA: Diagnosis not present

## 2015-07-17 DIAGNOSIS — L821 Other seborrheic keratosis: Secondary | ICD-10-CM | POA: Diagnosis not present

## 2015-07-17 DIAGNOSIS — D1801 Hemangioma of skin and subcutaneous tissue: Secondary | ICD-10-CM | POA: Diagnosis not present

## 2015-08-28 DIAGNOSIS — I890 Lymphedema, not elsewhere classified: Secondary | ICD-10-CM | POA: Diagnosis not present

## 2015-08-28 DIAGNOSIS — E032 Hypothyroidism due to medicaments and other exogenous substances: Secondary | ICD-10-CM | POA: Diagnosis not present

## 2015-08-28 DIAGNOSIS — K117 Disturbances of salivary secretion: Secondary | ICD-10-CM | POA: Diagnosis not present

## 2015-08-28 DIAGNOSIS — Z6823 Body mass index (BMI) 23.0-23.9, adult: Secondary | ICD-10-CM | POA: Diagnosis not present

## 2015-08-28 DIAGNOSIS — C01 Malignant neoplasm of base of tongue: Secondary | ICD-10-CM | POA: Diagnosis not present

## 2015-08-28 DIAGNOSIS — R432 Parageusia: Secondary | ICD-10-CM | POA: Diagnosis not present

## 2015-08-28 DIAGNOSIS — M436 Torticollis: Secondary | ICD-10-CM | POA: Diagnosis not present

## 2015-08-28 DIAGNOSIS — E039 Hypothyroidism, unspecified: Secondary | ICD-10-CM | POA: Diagnosis not present

## 2015-12-02 IMAGING — CT CT NECK W/ CM
4 series · 16 of 33 positions shown, 19 images · IV contrast (OMNIPAQUE)
Comparison: None.

CLINICAL DATA: Squamous cell carcinoma. Recent diagnosis of tongue
cancer.

EXAM:
CT NECK WITH CONTRAST
TECHNIQUE: Multidetector CT imaging of the neck was performed using the
standard protocol following the bolus administration of intravenous
contrast.
CONTRAST:  100mL OMNIPAQUE IOHEXOL 300 MG/ML  SOLN

[Series 2: neck st · axial · 0.45mm/px · z∈[-270,-76]mm · 5 of 147 slices shown, 7 images]
[im 25/147  soft-tissue]
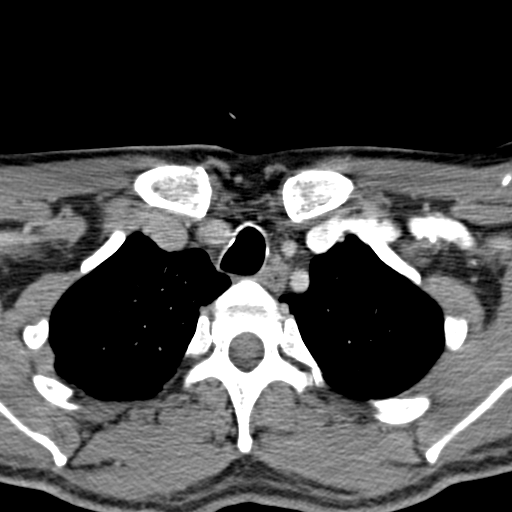
[im 25/147  bone]
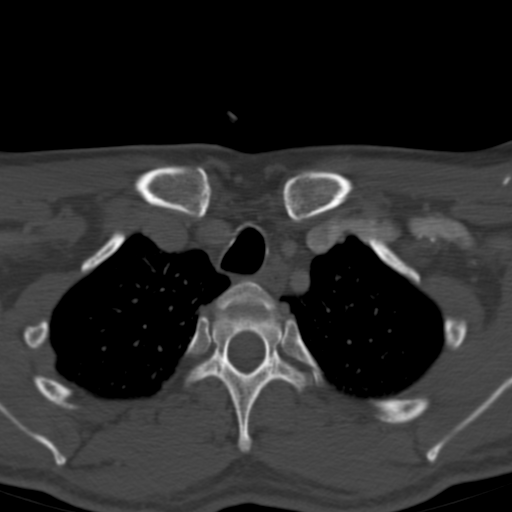
[im 49/147  bone]
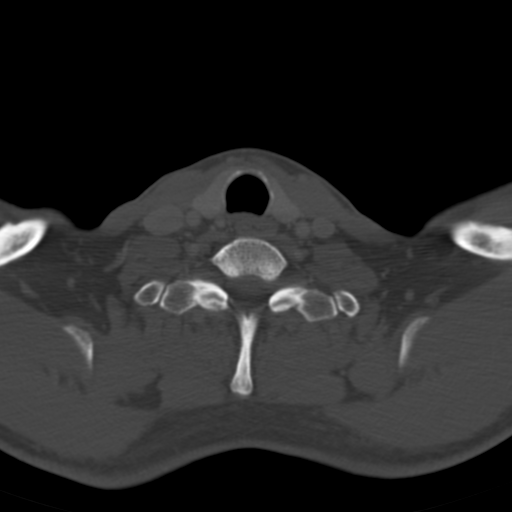
[im 74/147  bone]
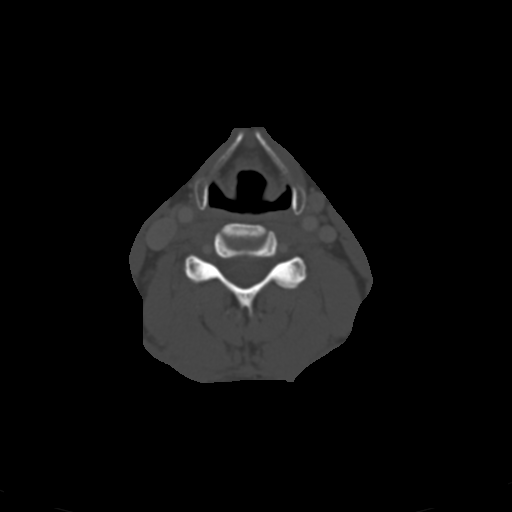
[im 98/147  bone]
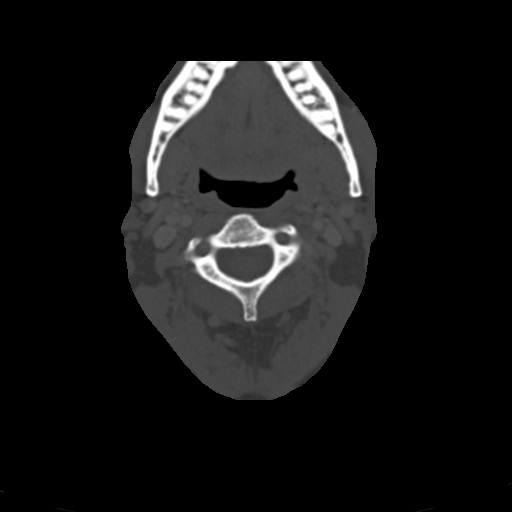
[im 122/147  soft-tissue]
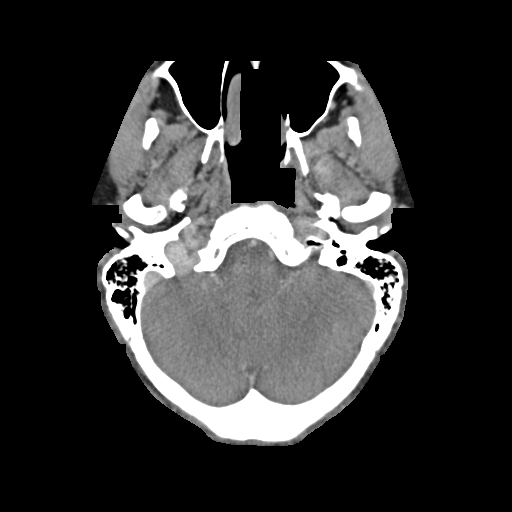
[im 122/147  bone]
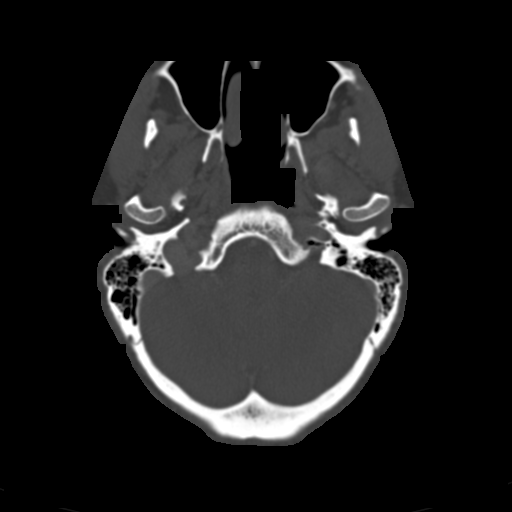

[Series 602: <mpr thick range> · axial · 0.57mm/px · z∈[-269,-172]mm · 3 of 145 slices shown]
[im 25/145  bone]
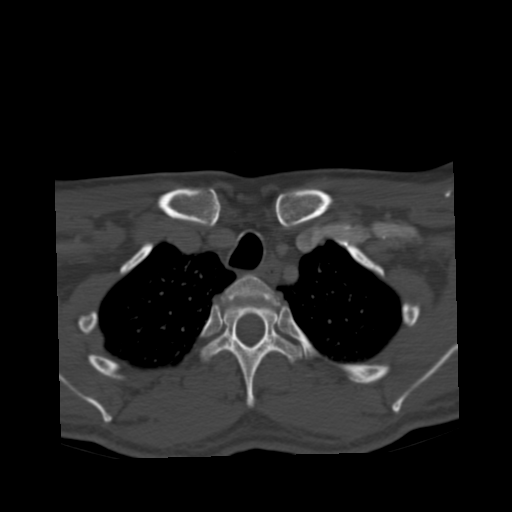
[im 49/145  bone]
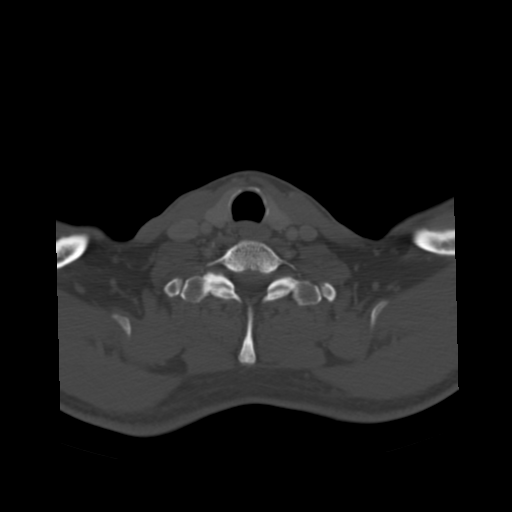
[im 73/145  bone]
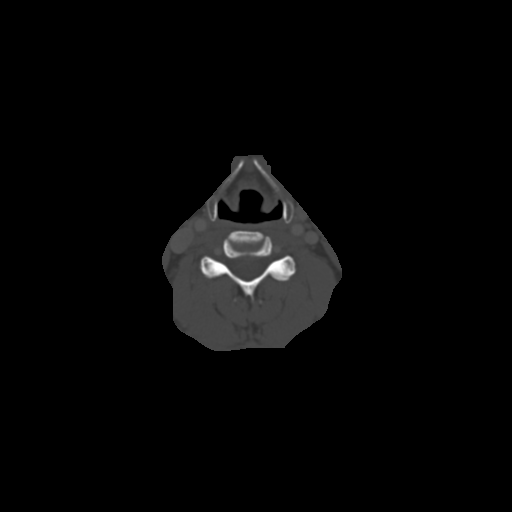

[Series 603: <mpr thick range(1)> · coronal · 0.57mm/px · 3 of 122 slices shown]
[im 25/122  bone]
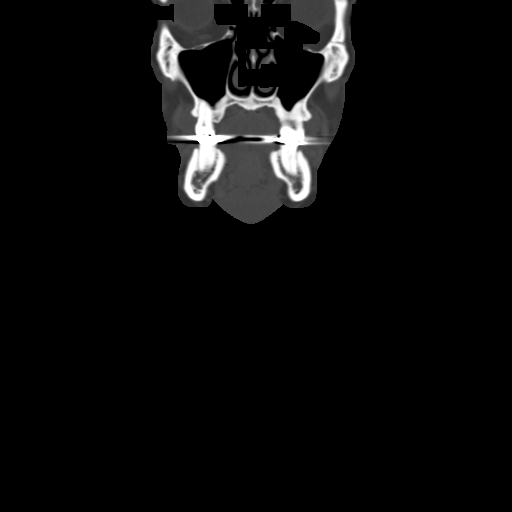
[im 49/122  bone]
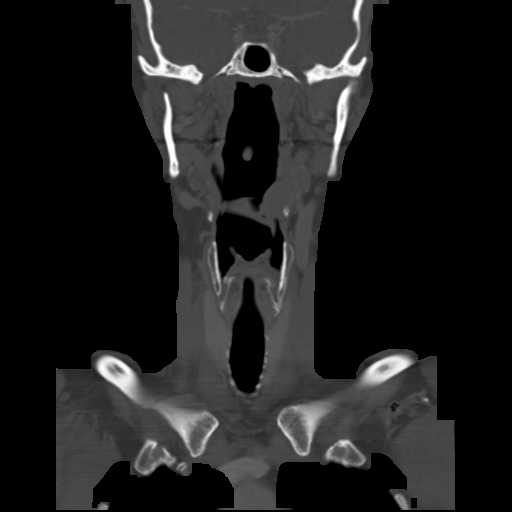
[im 73/122  bone]
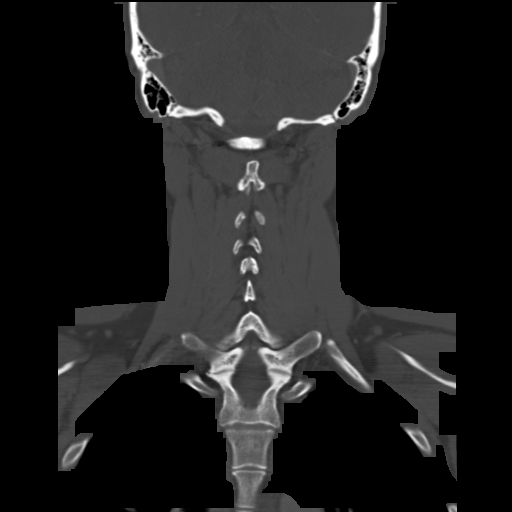

[Series 604: <mpr thick range(2)> · sagittal · 0.57mm/px · 5 of 79 slices shown, 6 images]
[im 27/79  bone]
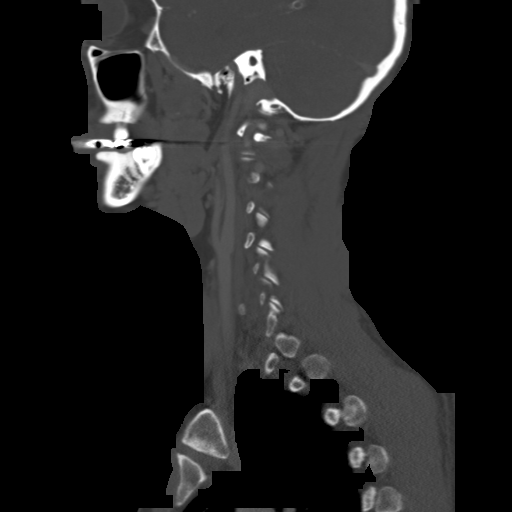
[im 33/79  bone]
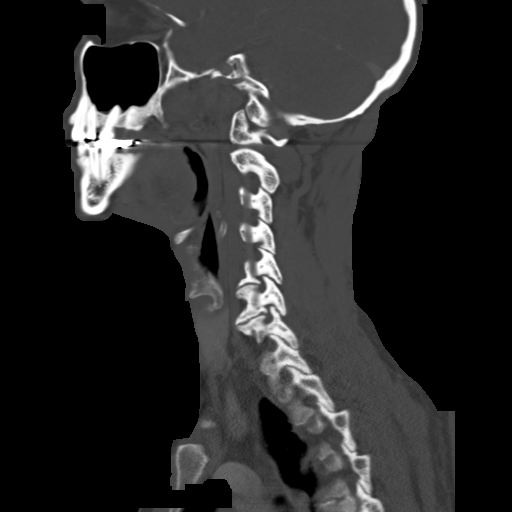
[im 40/79  soft-tissue]
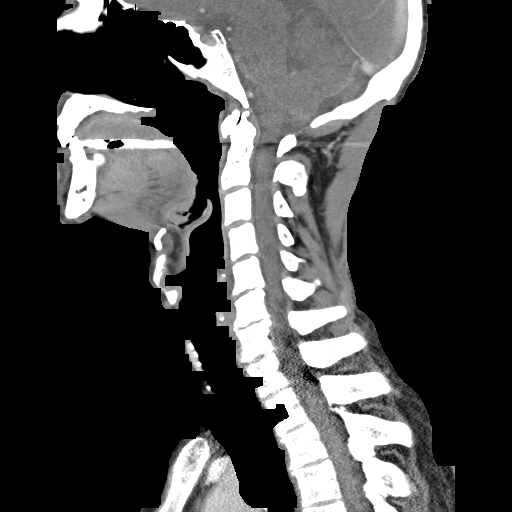
[im 40/79  bone]
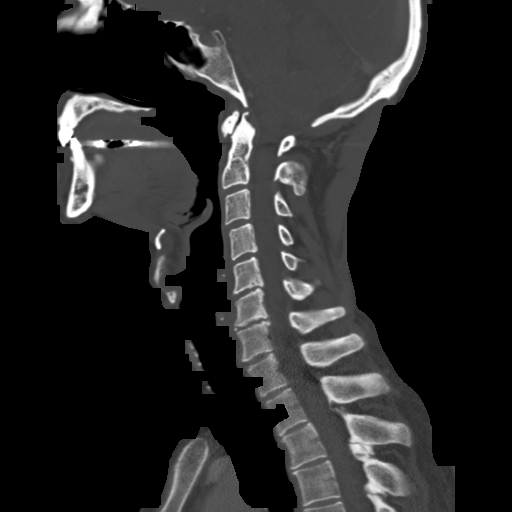
[im 46/79  bone]
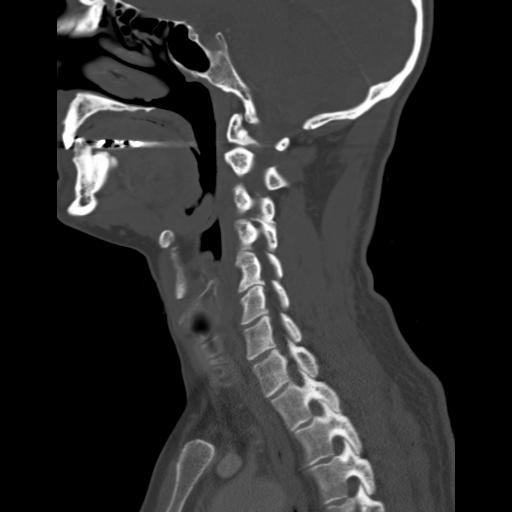
[im 53/79  bone]
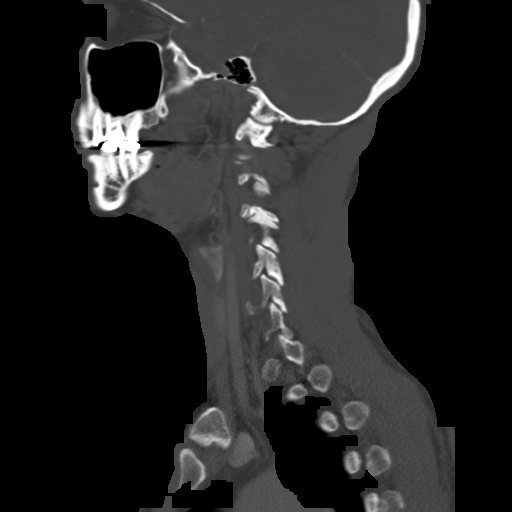

[16 of 33 positions shown; findings below may reference images not displayed]

FINDINGS: The visualized portions of the brain and orbits are unremarkable.
Trace left maxillary sinus mucosal thickening is noted. Mastoid air
cells are clear.

Nasopharynx is unremarkable. There is an ulcerated soft tissue mass
involving the left tongue base extending posteriorly and laterally
to involve the left lateral oropharynx in the region of the tonsil
and extending inferiorly into the left vallecula. The mass measures
approximately 2.7 x 1.9 x 2.3 cm and may slightly cross the midline
into the right aspect of the tongue base. The mass extends along the
upper surface of the left epiglottis.

No enlarged lymph nodes are identified in the neck by CT criteria,
however there is a mildly asymmetric left level II lymph node which
measures 6 mm in short axis (series 2, image 66). Thyroid,
submandibular, and parotid glands are unremarkable. Major vascular
structures of the neck appear patent. Minimal atherosclerotic
calcification is noted at the carotid bifurcations. Visualized lung
apices are clear. Moderate cervical disc degeneration is noted at
C5-6 and C6-7, and there is moderate left facet arthrosis at C3-4.
IMPRESSION: Left tongue base mass as above, consistent with provided history of
squamous cell carcinoma. No clearly enlarged cervical lymph nodes,
however a 6 mm left level II lymph node is asymmetric and
indeterminate.

## 2015-12-03 DIAGNOSIS — Z6823 Body mass index (BMI) 23.0-23.9, adult: Secondary | ICD-10-CM | POA: Diagnosis not present

## 2015-12-03 DIAGNOSIS — C01 Malignant neoplasm of base of tongue: Secondary | ICD-10-CM | POA: Diagnosis not present

## 2015-12-03 DIAGNOSIS — C140 Malignant neoplasm of pharynx, unspecified: Secondary | ICD-10-CM | POA: Diagnosis not present

## 2015-12-06 DIAGNOSIS — Z923 Personal history of irradiation: Secondary | ICD-10-CM | POA: Diagnosis not present

## 2015-12-21 DIAGNOSIS — R972 Elevated prostate specific antigen [PSA]: Secondary | ICD-10-CM | POA: Diagnosis not present

## 2015-12-21 DIAGNOSIS — C159 Malignant neoplasm of esophagus, unspecified: Secondary | ICD-10-CM | POA: Diagnosis not present

## 2015-12-21 DIAGNOSIS — Z125 Encounter for screening for malignant neoplasm of prostate: Secondary | ICD-10-CM | POA: Diagnosis not present

## 2015-12-21 DIAGNOSIS — Z8042 Family history of malignant neoplasm of prostate: Secondary | ICD-10-CM | POA: Diagnosis not present

## 2015-12-21 DIAGNOSIS — Z79899 Other long term (current) drug therapy: Secondary | ICD-10-CM | POA: Diagnosis not present

## 2015-12-21 DIAGNOSIS — Z6822 Body mass index (BMI) 22.0-22.9, adult: Secondary | ICD-10-CM | POA: Diagnosis not present

## 2015-12-21 DIAGNOSIS — R300 Dysuria: Secondary | ICD-10-CM | POA: Diagnosis not present

## 2016-01-22 DIAGNOSIS — D1801 Hemangioma of skin and subcutaneous tissue: Secondary | ICD-10-CM | POA: Diagnosis not present

## 2016-01-22 DIAGNOSIS — L723 Sebaceous cyst: Secondary | ICD-10-CM | POA: Diagnosis not present

## 2016-01-22 DIAGNOSIS — D225 Melanocytic nevi of trunk: Secondary | ICD-10-CM | POA: Diagnosis not present

## 2016-01-22 DIAGNOSIS — D2271 Melanocytic nevi of right lower limb, including hip: Secondary | ICD-10-CM | POA: Diagnosis not present

## 2016-01-22 DIAGNOSIS — D2272 Melanocytic nevi of left lower limb, including hip: Secondary | ICD-10-CM | POA: Diagnosis not present

## 2016-01-22 DIAGNOSIS — L821 Other seborrheic keratosis: Secondary | ICD-10-CM | POA: Diagnosis not present

## 2016-01-22 DIAGNOSIS — D224 Melanocytic nevi of scalp and neck: Secondary | ICD-10-CM | POA: Diagnosis not present

## 2016-01-26 DIAGNOSIS — Z23 Encounter for immunization: Secondary | ICD-10-CM | POA: Diagnosis not present

## 2016-02-18 DIAGNOSIS — Z85810 Personal history of malignant neoplasm of tongue: Secondary | ICD-10-CM | POA: Diagnosis not present

## 2016-02-18 DIAGNOSIS — Z23 Encounter for immunization: Secondary | ICD-10-CM | POA: Diagnosis not present

## 2016-02-18 DIAGNOSIS — D239 Other benign neoplasm of skin, unspecified: Secondary | ICD-10-CM | POA: Diagnosis not present

## 2016-02-18 DIAGNOSIS — N529 Male erectile dysfunction, unspecified: Secondary | ICD-10-CM | POA: Diagnosis not present

## 2016-02-18 DIAGNOSIS — Z1389 Encounter for screening for other disorder: Secondary | ICD-10-CM | POA: Diagnosis not present

## 2016-02-18 DIAGNOSIS — E890 Postprocedural hypothyroidism: Secondary | ICD-10-CM | POA: Diagnosis not present

## 2016-02-18 DIAGNOSIS — Z0000 Encounter for general adult medical examination without abnormal findings: Secondary | ICD-10-CM | POA: Diagnosis not present

## 2016-03-04 DIAGNOSIS — E032 Hypothyroidism due to medicaments and other exogenous substances: Secondary | ICD-10-CM | POA: Diagnosis not present

## 2016-03-04 DIAGNOSIS — E039 Hypothyroidism, unspecified: Secondary | ICD-10-CM | POA: Diagnosis not present

## 2016-03-04 DIAGNOSIS — Z6822 Body mass index (BMI) 22.0-22.9, adult: Secondary | ICD-10-CM | POA: Diagnosis not present

## 2016-03-04 DIAGNOSIS — C140 Malignant neoplasm of pharynx, unspecified: Secondary | ICD-10-CM | POA: Diagnosis not present

## 2016-03-04 DIAGNOSIS — I890 Lymphedema, not elsewhere classified: Secondary | ICD-10-CM | POA: Diagnosis not present

## 2016-03-04 DIAGNOSIS — R432 Parageusia: Secondary | ICD-10-CM | POA: Diagnosis not present

## 2016-03-04 DIAGNOSIS — K117 Disturbances of salivary secretion: Secondary | ICD-10-CM | POA: Diagnosis not present

## 2016-03-04 DIAGNOSIS — C01 Malignant neoplasm of base of tongue: Secondary | ICD-10-CM | POA: Diagnosis not present

## 2016-03-04 DIAGNOSIS — M436 Torticollis: Secondary | ICD-10-CM | POA: Diagnosis not present

## 2016-03-04 DIAGNOSIS — K148 Other diseases of tongue: Secondary | ICD-10-CM | POA: Diagnosis not present

## 2016-04-22 DIAGNOSIS — H40011 Open angle with borderline findings, low risk, right eye: Secondary | ICD-10-CM | POA: Diagnosis not present

## 2016-04-22 DIAGNOSIS — H40012 Open angle with borderline findings, low risk, left eye: Secondary | ICD-10-CM | POA: Diagnosis not present

## 2016-07-11 DIAGNOSIS — F4323 Adjustment disorder with mixed anxiety and depressed mood: Secondary | ICD-10-CM | POA: Diagnosis not present

## 2016-07-24 DIAGNOSIS — F4323 Adjustment disorder with mixed anxiety and depressed mood: Secondary | ICD-10-CM | POA: Diagnosis not present

## 2016-08-05 DIAGNOSIS — F4323 Adjustment disorder with mixed anxiety and depressed mood: Secondary | ICD-10-CM | POA: Diagnosis not present

## 2016-08-21 DIAGNOSIS — F4323 Adjustment disorder with mixed anxiety and depressed mood: Secondary | ICD-10-CM | POA: Diagnosis not present

## 2016-09-03 DIAGNOSIS — E079 Disorder of thyroid, unspecified: Secondary | ICD-10-CM | POA: Diagnosis not present

## 2016-09-03 DIAGNOSIS — C01 Malignant neoplasm of base of tongue: Secondary | ICD-10-CM | POA: Diagnosis not present

## 2016-11-04 DIAGNOSIS — H43811 Vitreous degeneration, right eye: Secondary | ICD-10-CM | POA: Diagnosis not present

## 2016-11-04 DIAGNOSIS — H40013 Open angle with borderline findings, low risk, bilateral: Secondary | ICD-10-CM | POA: Diagnosis not present

## 2016-12-26 DIAGNOSIS — Z6822 Body mass index (BMI) 22.0-22.9, adult: Secondary | ICD-10-CM | POA: Diagnosis not present

## 2016-12-26 DIAGNOSIS — R972 Elevated prostate specific antigen [PSA]: Secondary | ICD-10-CM | POA: Diagnosis not present

## 2017-01-24 DIAGNOSIS — Z23 Encounter for immunization: Secondary | ICD-10-CM | POA: Diagnosis not present

## 2017-01-28 DIAGNOSIS — D225 Melanocytic nevi of trunk: Secondary | ICD-10-CM | POA: Diagnosis not present

## 2017-01-28 DIAGNOSIS — D1801 Hemangioma of skin and subcutaneous tissue: Secondary | ICD-10-CM | POA: Diagnosis not present

## 2017-01-28 DIAGNOSIS — D485 Neoplasm of uncertain behavior of skin: Secondary | ICD-10-CM | POA: Diagnosis not present

## 2017-01-28 DIAGNOSIS — L821 Other seborrheic keratosis: Secondary | ICD-10-CM | POA: Diagnosis not present

## 2017-01-28 DIAGNOSIS — D2261 Melanocytic nevi of right upper limb, including shoulder: Secondary | ICD-10-CM | POA: Diagnosis not present

## 2017-01-28 DIAGNOSIS — D2271 Melanocytic nevi of right lower limb, including hip: Secondary | ICD-10-CM | POA: Diagnosis not present

## 2017-01-28 DIAGNOSIS — D2272 Melanocytic nevi of left lower limb, including hip: Secondary | ICD-10-CM | POA: Diagnosis not present

## 2017-01-28 DIAGNOSIS — D2262 Melanocytic nevi of left upper limb, including shoulder: Secondary | ICD-10-CM | POA: Diagnosis not present

## 2017-01-28 DIAGNOSIS — L720 Epidermal cyst: Secondary | ICD-10-CM | POA: Diagnosis not present

## 2017-03-05 DIAGNOSIS — E032 Hypothyroidism due to medicaments and other exogenous substances: Secondary | ICD-10-CM | POA: Diagnosis not present

## 2017-03-05 DIAGNOSIS — K148 Other diseases of tongue: Secondary | ICD-10-CM | POA: Diagnosis not present

## 2017-03-05 DIAGNOSIS — I890 Lymphedema, not elsewhere classified: Secondary | ICD-10-CM | POA: Diagnosis not present

## 2017-03-05 DIAGNOSIS — M436 Torticollis: Secondary | ICD-10-CM | POA: Diagnosis not present

## 2017-03-05 DIAGNOSIS — K117 Disturbances of salivary secretion: Secondary | ICD-10-CM | POA: Diagnosis not present

## 2017-03-05 DIAGNOSIS — R432 Parageusia: Secondary | ICD-10-CM | POA: Diagnosis not present

## 2017-03-05 DIAGNOSIS — Z6821 Body mass index (BMI) 21.0-21.9, adult: Secondary | ICD-10-CM | POA: Diagnosis not present

## 2017-03-05 DIAGNOSIS — E039 Hypothyroidism, unspecified: Secondary | ICD-10-CM | POA: Diagnosis not present

## 2017-03-05 DIAGNOSIS — C140 Malignant neoplasm of pharynx, unspecified: Secondary | ICD-10-CM | POA: Diagnosis not present

## 2017-03-05 DIAGNOSIS — C01 Malignant neoplasm of base of tongue: Secondary | ICD-10-CM | POA: Diagnosis not present

## 2017-03-19 DIAGNOSIS — Z85810 Personal history of malignant neoplasm of tongue: Secondary | ICD-10-CM | POA: Diagnosis not present

## 2017-03-19 DIAGNOSIS — Z0000 Encounter for general adult medical examination without abnormal findings: Secondary | ICD-10-CM | POA: Diagnosis not present

## 2017-03-19 DIAGNOSIS — R079 Chest pain, unspecified: Secondary | ICD-10-CM | POA: Diagnosis not present

## 2017-03-19 DIAGNOSIS — Z1389 Encounter for screening for other disorder: Secondary | ICD-10-CM | POA: Diagnosis not present

## 2017-03-19 DIAGNOSIS — N529 Male erectile dysfunction, unspecified: Secondary | ICD-10-CM | POA: Diagnosis not present

## 2017-03-19 DIAGNOSIS — E890 Postprocedural hypothyroidism: Secondary | ICD-10-CM | POA: Diagnosis not present

## 2017-04-01 DIAGNOSIS — M542 Cervicalgia: Secondary | ICD-10-CM | POA: Diagnosis not present

## 2017-04-01 DIAGNOSIS — M25511 Pain in right shoulder: Secondary | ICD-10-CM | POA: Diagnosis not present

## 2017-04-07 DIAGNOSIS — M25511 Pain in right shoulder: Secondary | ICD-10-CM | POA: Diagnosis not present

## 2017-04-07 DIAGNOSIS — M542 Cervicalgia: Secondary | ICD-10-CM | POA: Diagnosis not present

## 2017-04-09 DIAGNOSIS — M25511 Pain in right shoulder: Secondary | ICD-10-CM | POA: Diagnosis not present

## 2017-04-09 DIAGNOSIS — M542 Cervicalgia: Secondary | ICD-10-CM | POA: Diagnosis not present

## 2017-04-14 DIAGNOSIS — M542 Cervicalgia: Secondary | ICD-10-CM | POA: Diagnosis not present

## 2017-04-14 DIAGNOSIS — M25511 Pain in right shoulder: Secondary | ICD-10-CM | POA: Diagnosis not present

## 2017-04-16 DIAGNOSIS — M542 Cervicalgia: Secondary | ICD-10-CM | POA: Diagnosis not present

## 2017-04-16 DIAGNOSIS — M25511 Pain in right shoulder: Secondary | ICD-10-CM | POA: Diagnosis not present

## 2017-04-21 DIAGNOSIS — M25511 Pain in right shoulder: Secondary | ICD-10-CM | POA: Diagnosis not present

## 2017-04-21 DIAGNOSIS — M542 Cervicalgia: Secondary | ICD-10-CM | POA: Diagnosis not present

## 2017-04-30 DIAGNOSIS — M542 Cervicalgia: Secondary | ICD-10-CM | POA: Diagnosis not present

## 2017-04-30 DIAGNOSIS — M25511 Pain in right shoulder: Secondary | ICD-10-CM | POA: Diagnosis not present

## 2017-05-04 DIAGNOSIS — M25511 Pain in right shoulder: Secondary | ICD-10-CM | POA: Diagnosis not present

## 2017-05-04 DIAGNOSIS — M542 Cervicalgia: Secondary | ICD-10-CM | POA: Diagnosis not present

## 2017-05-07 DIAGNOSIS — M542 Cervicalgia: Secondary | ICD-10-CM | POA: Diagnosis not present

## 2017-05-07 DIAGNOSIS — M25511 Pain in right shoulder: Secondary | ICD-10-CM | POA: Diagnosis not present

## 2017-05-11 DIAGNOSIS — M25511 Pain in right shoulder: Secondary | ICD-10-CM | POA: Diagnosis not present

## 2017-05-11 DIAGNOSIS — M542 Cervicalgia: Secondary | ICD-10-CM | POA: Diagnosis not present

## 2017-05-13 DIAGNOSIS — H40013 Open angle with borderline findings, low risk, bilateral: Secondary | ICD-10-CM | POA: Diagnosis not present

## 2017-05-14 DIAGNOSIS — M25511 Pain in right shoulder: Secondary | ICD-10-CM | POA: Diagnosis not present

## 2017-05-14 DIAGNOSIS — M542 Cervicalgia: Secondary | ICD-10-CM | POA: Diagnosis not present

## 2017-05-19 DIAGNOSIS — M25511 Pain in right shoulder: Secondary | ICD-10-CM | POA: Diagnosis not present

## 2017-05-19 DIAGNOSIS — M542 Cervicalgia: Secondary | ICD-10-CM | POA: Diagnosis not present

## 2017-05-26 DIAGNOSIS — M25511 Pain in right shoulder: Secondary | ICD-10-CM | POA: Diagnosis not present

## 2017-05-26 DIAGNOSIS — M542 Cervicalgia: Secondary | ICD-10-CM | POA: Diagnosis not present

## 2017-06-02 DIAGNOSIS — M542 Cervicalgia: Secondary | ICD-10-CM | POA: Diagnosis not present

## 2017-06-02 DIAGNOSIS — M25511 Pain in right shoulder: Secondary | ICD-10-CM | POA: Diagnosis not present

## 2017-06-11 DIAGNOSIS — M25511 Pain in right shoulder: Secondary | ICD-10-CM | POA: Diagnosis not present

## 2017-06-11 DIAGNOSIS — M542 Cervicalgia: Secondary | ICD-10-CM | POA: Diagnosis not present

## 2017-06-17 DIAGNOSIS — M25511 Pain in right shoulder: Secondary | ICD-10-CM | POA: Diagnosis not present

## 2017-06-17 DIAGNOSIS — M542 Cervicalgia: Secondary | ICD-10-CM | POA: Diagnosis not present

## 2017-06-30 DIAGNOSIS — M25511 Pain in right shoulder: Secondary | ICD-10-CM | POA: Diagnosis not present

## 2017-06-30 DIAGNOSIS — M542 Cervicalgia: Secondary | ICD-10-CM | POA: Diagnosis not present

## 2017-07-29 DIAGNOSIS — D2261 Melanocytic nevi of right upper limb, including shoulder: Secondary | ICD-10-CM | POA: Diagnosis not present

## 2017-07-29 DIAGNOSIS — D485 Neoplasm of uncertain behavior of skin: Secondary | ICD-10-CM | POA: Diagnosis not present

## 2017-07-29 DIAGNOSIS — D2262 Melanocytic nevi of left upper limb, including shoulder: Secondary | ICD-10-CM | POA: Diagnosis not present

## 2017-07-29 DIAGNOSIS — D224 Melanocytic nevi of scalp and neck: Secondary | ICD-10-CM | POA: Diagnosis not present

## 2017-07-29 DIAGNOSIS — D1801 Hemangioma of skin and subcutaneous tissue: Secondary | ICD-10-CM | POA: Diagnosis not present

## 2017-07-29 DIAGNOSIS — L821 Other seborrheic keratosis: Secondary | ICD-10-CM | POA: Diagnosis not present

## 2017-07-29 DIAGNOSIS — D225 Melanocytic nevi of trunk: Secondary | ICD-10-CM | POA: Diagnosis not present

## 2017-07-29 DIAGNOSIS — D2272 Melanocytic nevi of left lower limb, including hip: Secondary | ICD-10-CM | POA: Diagnosis not present

## 2017-07-29 DIAGNOSIS — L723 Sebaceous cyst: Secondary | ICD-10-CM | POA: Diagnosis not present

## 2017-07-29 DIAGNOSIS — L570 Actinic keratosis: Secondary | ICD-10-CM | POA: Diagnosis not present

## 2017-08-05 DIAGNOSIS — R69 Illness, unspecified: Secondary | ICD-10-CM | POA: Diagnosis not present

## 2017-08-19 DIAGNOSIS — Z6821 Body mass index (BMI) 21.0-21.9, adult: Secondary | ICD-10-CM | POA: Diagnosis not present

## 2017-08-19 DIAGNOSIS — E039 Hypothyroidism, unspecified: Secondary | ICD-10-CM | POA: Diagnosis not present

## 2017-11-10 DIAGNOSIS — H2513 Age-related nuclear cataract, bilateral: Secondary | ICD-10-CM | POA: Diagnosis not present

## 2017-11-10 DIAGNOSIS — H40013 Open angle with borderline findings, low risk, bilateral: Secondary | ICD-10-CM | POA: Diagnosis not present

## 2017-12-08 DIAGNOSIS — R69 Illness, unspecified: Secondary | ICD-10-CM | POA: Diagnosis not present

## 2018-01-06 DIAGNOSIS — R972 Elevated prostate specific antigen [PSA]: Secondary | ICD-10-CM | POA: Diagnosis not present

## 2018-01-06 DIAGNOSIS — Z6822 Body mass index (BMI) 22.0-22.9, adult: Secondary | ICD-10-CM | POA: Diagnosis not present

## 2018-01-06 DIAGNOSIS — Z8042 Family history of malignant neoplasm of prostate: Secondary | ICD-10-CM | POA: Diagnosis not present

## 2018-01-06 DIAGNOSIS — N529 Male erectile dysfunction, unspecified: Secondary | ICD-10-CM | POA: Diagnosis not present

## 2018-01-06 DIAGNOSIS — Z9221 Personal history of antineoplastic chemotherapy: Secondary | ICD-10-CM | POA: Diagnosis not present

## 2018-01-06 DIAGNOSIS — Z923 Personal history of irradiation: Secondary | ICD-10-CM | POA: Diagnosis not present

## 2018-01-06 DIAGNOSIS — Z79899 Other long term (current) drug therapy: Secondary | ICD-10-CM | POA: Diagnosis not present

## 2018-01-16 DIAGNOSIS — R69 Illness, unspecified: Secondary | ICD-10-CM | POA: Diagnosis not present

## 2018-01-26 DIAGNOSIS — D1801 Hemangioma of skin and subcutaneous tissue: Secondary | ICD-10-CM | POA: Diagnosis not present

## 2018-01-26 DIAGNOSIS — L570 Actinic keratosis: Secondary | ICD-10-CM | POA: Diagnosis not present

## 2018-01-26 DIAGNOSIS — L821 Other seborrheic keratosis: Secondary | ICD-10-CM | POA: Diagnosis not present

## 2018-01-26 DIAGNOSIS — D225 Melanocytic nevi of trunk: Secondary | ICD-10-CM | POA: Diagnosis not present

## 2018-01-26 DIAGNOSIS — D485 Neoplasm of uncertain behavior of skin: Secondary | ICD-10-CM | POA: Diagnosis not present

## 2018-01-26 DIAGNOSIS — L723 Sebaceous cyst: Secondary | ICD-10-CM | POA: Diagnosis not present

## 2018-02-25 DIAGNOSIS — L988 Other specified disorders of the skin and subcutaneous tissue: Secondary | ICD-10-CM | POA: Diagnosis not present

## 2018-02-25 DIAGNOSIS — D485 Neoplasm of uncertain behavior of skin: Secondary | ICD-10-CM | POA: Diagnosis not present

## 2018-03-02 DIAGNOSIS — Z923 Personal history of irradiation: Secondary | ICD-10-CM | POA: Diagnosis not present

## 2018-03-02 DIAGNOSIS — E079 Disorder of thyroid, unspecified: Secondary | ICD-10-CM | POA: Diagnosis not present

## 2018-03-02 DIAGNOSIS — C01 Malignant neoplasm of base of tongue: Secondary | ICD-10-CM | POA: Diagnosis not present

## 2018-03-02 DIAGNOSIS — Z6822 Body mass index (BMI) 22.0-22.9, adult: Secondary | ICD-10-CM | POA: Diagnosis not present

## 2018-03-11 DIAGNOSIS — Z4802 Encounter for removal of sutures: Secondary | ICD-10-CM | POA: Diagnosis not present

## 2018-03-25 DIAGNOSIS — Z1389 Encounter for screening for other disorder: Secondary | ICD-10-CM | POA: Diagnosis not present

## 2018-03-25 DIAGNOSIS — Z85810 Personal history of malignant neoplasm of tongue: Secondary | ICD-10-CM | POA: Diagnosis not present

## 2018-03-25 DIAGNOSIS — E890 Postprocedural hypothyroidism: Secondary | ICD-10-CM | POA: Diagnosis not present

## 2018-03-25 DIAGNOSIS — Z0000 Encounter for general adult medical examination without abnormal findings: Secondary | ICD-10-CM | POA: Diagnosis not present

## 2018-03-25 DIAGNOSIS — D239 Other benign neoplasm of skin, unspecified: Secondary | ICD-10-CM | POA: Diagnosis not present

## 2018-03-25 DIAGNOSIS — Z136 Encounter for screening for cardiovascular disorders: Secondary | ICD-10-CM | POA: Diagnosis not present

## 2018-03-31 DIAGNOSIS — Z7689 Persons encountering health services in other specified circumstances: Secondary | ICD-10-CM | POA: Diagnosis not present

## 2018-04-13 DIAGNOSIS — R69 Illness, unspecified: Secondary | ICD-10-CM | POA: Diagnosis not present

## 2018-04-26 MED FILL — SF 1.1% GEL: 1.1 | 30 days supply | Qty: 168 | Fill #0

## 2018-07-14 DIAGNOSIS — Z125 Encounter for screening for malignant neoplasm of prostate: Secondary | ICD-10-CM | POA: Diagnosis not present

## 2018-07-14 DIAGNOSIS — Z6822 Body mass index (BMI) 22.0-22.9, adult: Secondary | ICD-10-CM | POA: Diagnosis not present

## 2018-07-14 DIAGNOSIS — R972 Elevated prostate specific antigen [PSA]: Secondary | ICD-10-CM | POA: Diagnosis not present

## 2018-08-02 DIAGNOSIS — D2271 Melanocytic nevi of right lower limb, including hip: Secondary | ICD-10-CM | POA: Diagnosis not present

## 2018-08-02 DIAGNOSIS — L720 Epidermal cyst: Secondary | ICD-10-CM | POA: Diagnosis not present

## 2018-08-02 DIAGNOSIS — L820 Inflamed seborrheic keratosis: Secondary | ICD-10-CM | POA: Diagnosis not present

## 2018-08-02 DIAGNOSIS — L821 Other seborrheic keratosis: Secondary | ICD-10-CM | POA: Diagnosis not present

## 2018-08-02 DIAGNOSIS — D224 Melanocytic nevi of scalp and neck: Secondary | ICD-10-CM | POA: Diagnosis not present

## 2018-08-02 DIAGNOSIS — L570 Actinic keratosis: Secondary | ICD-10-CM | POA: Diagnosis not present

## 2018-08-02 DIAGNOSIS — D225 Melanocytic nevi of trunk: Secondary | ICD-10-CM | POA: Diagnosis not present

## 2018-08-02 DIAGNOSIS — D2272 Melanocytic nevi of left lower limb, including hip: Secondary | ICD-10-CM | POA: Diagnosis not present

## 2018-10-06 DIAGNOSIS — R69 Illness, unspecified: Secondary | ICD-10-CM | POA: Diagnosis not present

## 2018-11-15 DIAGNOSIS — Z6822 Body mass index (BMI) 22.0-22.9, adult: Secondary | ICD-10-CM | POA: Diagnosis not present

## 2018-11-15 DIAGNOSIS — Z85819 Personal history of malignant neoplasm of unspecified site of lip, oral cavity, and pharynx: Secondary | ICD-10-CM | POA: Diagnosis not present

## 2018-11-15 DIAGNOSIS — Z91040 Latex allergy status: Secondary | ICD-10-CM | POA: Diagnosis not present

## 2018-11-15 DIAGNOSIS — Z9221 Personal history of antineoplastic chemotherapy: Secondary | ICD-10-CM | POA: Diagnosis not present

## 2018-11-15 DIAGNOSIS — Z923 Personal history of irradiation: Secondary | ICD-10-CM | POA: Diagnosis not present

## 2018-11-15 DIAGNOSIS — E039 Hypothyroidism, unspecified: Secondary | ICD-10-CM | POA: Diagnosis not present

## 2018-11-15 DIAGNOSIS — C01 Malignant neoplasm of base of tongue: Secondary | ICD-10-CM | POA: Diagnosis not present

## 2018-11-15 DIAGNOSIS — Z882 Allergy status to sulfonamides status: Secondary | ICD-10-CM | POA: Diagnosis not present

## 2018-11-16 DIAGNOSIS — H40013 Open angle with borderline findings, low risk, bilateral: Secondary | ICD-10-CM | POA: Diagnosis not present

## 2019-01-22 DIAGNOSIS — R69 Illness, unspecified: Secondary | ICD-10-CM | POA: Diagnosis not present

## 2019-02-02 DIAGNOSIS — D1801 Hemangioma of skin and subcutaneous tissue: Secondary | ICD-10-CM | POA: Diagnosis not present

## 2019-02-02 DIAGNOSIS — L821 Other seborrheic keratosis: Secondary | ICD-10-CM | POA: Diagnosis not present

## 2019-02-02 DIAGNOSIS — L814 Other melanin hyperpigmentation: Secondary | ICD-10-CM | POA: Diagnosis not present

## 2019-02-02 DIAGNOSIS — D2262 Melanocytic nevi of left upper limb, including shoulder: Secondary | ICD-10-CM | POA: Diagnosis not present

## 2019-02-02 DIAGNOSIS — D485 Neoplasm of uncertain behavior of skin: Secondary | ICD-10-CM | POA: Diagnosis not present

## 2019-02-02 DIAGNOSIS — L738 Other specified follicular disorders: Secondary | ICD-10-CM | POA: Diagnosis not present

## 2019-02-02 DIAGNOSIS — D225 Melanocytic nevi of trunk: Secondary | ICD-10-CM | POA: Diagnosis not present

## 2019-02-02 DIAGNOSIS — L723 Sebaceous cyst: Secondary | ICD-10-CM | POA: Diagnosis not present

## 2019-02-02 DIAGNOSIS — D2261 Melanocytic nevi of right upper limb, including shoulder: Secondary | ICD-10-CM | POA: Diagnosis not present

## 2019-02-02 DIAGNOSIS — D2239 Melanocytic nevi of other parts of face: Secondary | ICD-10-CM | POA: Diagnosis not present

## 2019-02-15 DIAGNOSIS — R69 Illness, unspecified: Secondary | ICD-10-CM | POA: Diagnosis not present

## 2019-03-01 DIAGNOSIS — C01 Malignant neoplasm of base of tongue: Secondary | ICD-10-CM | POA: Diagnosis not present

## 2019-03-01 DIAGNOSIS — Z125 Encounter for screening for malignant neoplasm of prostate: Secondary | ICD-10-CM | POA: Diagnosis not present

## 2019-03-01 DIAGNOSIS — Z6822 Body mass index (BMI) 22.0-22.9, adult: Secondary | ICD-10-CM | POA: Diagnosis not present

## 2019-03-01 DIAGNOSIS — Z923 Personal history of irradiation: Secondary | ICD-10-CM | POA: Diagnosis not present

## 2019-03-01 DIAGNOSIS — Z85819 Personal history of malignant neoplasm of unspecified site of lip, oral cavity, and pharynx: Secondary | ICD-10-CM | POA: Diagnosis not present

## 2019-03-01 DIAGNOSIS — E039 Hypothyroidism, unspecified: Secondary | ICD-10-CM | POA: Diagnosis not present

## 2019-03-01 DIAGNOSIS — R972 Elevated prostate specific antigen [PSA]: Secondary | ICD-10-CM | POA: Diagnosis not present

## 2019-03-04 DIAGNOSIS — Z20828 Contact with and (suspected) exposure to other viral communicable diseases: Secondary | ICD-10-CM | POA: Diagnosis not present

## 2019-03-22 DIAGNOSIS — Z6822 Body mass index (BMI) 22.0-22.9, adult: Secondary | ICD-10-CM | POA: Diagnosis not present

## 2019-03-22 DIAGNOSIS — R972 Elevated prostate specific antigen [PSA]: Secondary | ICD-10-CM | POA: Diagnosis not present

## 2019-03-22 DIAGNOSIS — N528 Other male erectile dysfunction: Secondary | ICD-10-CM | POA: Diagnosis not present

## 2019-03-22 DIAGNOSIS — Z125 Encounter for screening for malignant neoplasm of prostate: Secondary | ICD-10-CM | POA: Diagnosis not present

## 2019-04-04 ENCOUNTER — Other Ambulatory Visit: Payer: Self-pay | Admitting: Gastroenterology

## 2019-04-04 DIAGNOSIS — Z85810 Personal history of malignant neoplasm of tongue: Secondary | ICD-10-CM | POA: Diagnosis not present

## 2019-04-04 DIAGNOSIS — R1310 Dysphagia, unspecified: Secondary | ICD-10-CM | POA: Diagnosis not present

## 2019-04-12 DIAGNOSIS — D239 Other benign neoplasm of skin, unspecified: Secondary | ICD-10-CM | POA: Diagnosis not present

## 2019-04-12 DIAGNOSIS — Z0000 Encounter for general adult medical examination without abnormal findings: Secondary | ICD-10-CM | POA: Diagnosis not present

## 2019-04-12 DIAGNOSIS — Z1211 Encounter for screening for malignant neoplasm of colon: Secondary | ICD-10-CM | POA: Diagnosis not present

## 2019-04-12 DIAGNOSIS — N529 Male erectile dysfunction, unspecified: Secondary | ICD-10-CM | POA: Diagnosis not present

## 2019-04-12 DIAGNOSIS — Z1389 Encounter for screening for other disorder: Secondary | ICD-10-CM | POA: Diagnosis not present

## 2019-04-12 DIAGNOSIS — R1310 Dysphagia, unspecified: Secondary | ICD-10-CM | POA: Diagnosis not present

## 2019-04-12 DIAGNOSIS — E890 Postprocedural hypothyroidism: Secondary | ICD-10-CM | POA: Diagnosis not present

## 2019-04-12 DIAGNOSIS — Z85810 Personal history of malignant neoplasm of tongue: Secondary | ICD-10-CM | POA: Diagnosis not present

## 2019-04-14 ENCOUNTER — Ambulatory Visit
Admission: RE | Admit: 2019-04-14 | Discharge: 2019-04-14 | Disposition: A | Discharge status: Home / Self Care | Payer: Medicare Other | Source: Ambulatory Visit | Attending: Gastroenterology | Admitting: Gastroenterology

## 2019-04-14 DIAGNOSIS — Z1211 Encounter for screening for malignant neoplasm of colon: Secondary | ICD-10-CM | POA: Diagnosis not present

## 2019-04-14 DIAGNOSIS — K228 Other specified diseases of esophagus: Secondary | ICD-10-CM | POA: Diagnosis not present

## 2019-04-14 DIAGNOSIS — R1310 Dysphagia, unspecified: Secondary | ICD-10-CM

## 2019-04-21 ENCOUNTER — Other Ambulatory Visit: Payer: Self-pay | Admitting: Physician Assistant

## 2019-04-21 DIAGNOSIS — R933 Abnormal findings on diagnostic imaging of other parts of digestive tract: Secondary | ICD-10-CM

## 2019-04-29 ENCOUNTER — Other Ambulatory Visit: Payer: Medicare HMO

## 2019-05-02 ENCOUNTER — Ambulatory Visit
Admission: RE | Admit: 2019-05-02 | Discharge: 2019-05-02 | Disposition: A | Discharge status: Home / Self Care | Payer: Medicare HMO | Source: Ambulatory Visit | Attending: Physician Assistant | Admitting: Physician Assistant

## 2019-05-02 DIAGNOSIS — R933 Abnormal findings on diagnostic imaging of other parts of digestive tract: Secondary | ICD-10-CM

## 2019-05-02 DIAGNOSIS — J392 Other diseases of pharynx: Secondary | ICD-10-CM | POA: Diagnosis not present

## 2019-05-02 MED ORDER — IOPAMIDOL (ISOVUE-300) INJECTION 61%
75.0000 mL | Freq: Once | INTRAVENOUS | Status: AC | PRN
  Administered 2019-05-02: 75 mL via INTRAVENOUS

## 2020-07-11 DIAGNOSIS — C61 Malignant neoplasm of prostate: Secondary | ICD-10-CM | POA: Diagnosis not present

## 2020-07-11 DIAGNOSIS — Z6822 Body mass index (BMI) 22.0-22.9, adult: Secondary | ICD-10-CM | POA: Diagnosis not present

## 2020-08-07 DIAGNOSIS — D225 Melanocytic nevi of trunk: Secondary | ICD-10-CM | POA: Diagnosis not present

## 2020-08-07 DIAGNOSIS — L570 Actinic keratosis: Secondary | ICD-10-CM | POA: Diagnosis not present

## 2020-08-07 DIAGNOSIS — D2371 Other benign neoplasm of skin of right lower limb, including hip: Secondary | ICD-10-CM | POA: Diagnosis not present

## 2020-08-07 DIAGNOSIS — D1801 Hemangioma of skin and subcutaneous tissue: Secondary | ICD-10-CM | POA: Diagnosis not present

## 2020-08-07 DIAGNOSIS — L738 Other specified follicular disorders: Secondary | ICD-10-CM | POA: Diagnosis not present

## 2020-08-07 DIAGNOSIS — D485 Neoplasm of uncertain behavior of skin: Secondary | ICD-10-CM | POA: Diagnosis not present

## 2020-08-07 DIAGNOSIS — L720 Epidermal cyst: Secondary | ICD-10-CM | POA: Diagnosis not present

## 2020-08-07 DIAGNOSIS — L821 Other seborrheic keratosis: Secondary | ICD-10-CM | POA: Diagnosis not present

## 2020-09-10 DIAGNOSIS — C61 Malignant neoplasm of prostate: Secondary | ICD-10-CM | POA: Diagnosis not present

## 2020-09-10 DIAGNOSIS — Z85810 Personal history of malignant neoplasm of tongue: Secondary | ICD-10-CM | POA: Diagnosis not present

## 2020-09-10 DIAGNOSIS — Z79890 Hormone replacement therapy (postmenopausal): Secondary | ICD-10-CM | POA: Diagnosis not present

## 2020-09-10 DIAGNOSIS — Z91040 Latex allergy status: Secondary | ICD-10-CM | POA: Diagnosis not present

## 2020-09-10 DIAGNOSIS — Z79899 Other long term (current) drug therapy: Secondary | ICD-10-CM | POA: Diagnosis not present

## 2020-09-10 DIAGNOSIS — Z923 Personal history of irradiation: Secondary | ICD-10-CM | POA: Diagnosis not present

## 2020-09-10 DIAGNOSIS — Z8042 Family history of malignant neoplasm of prostate: Secondary | ICD-10-CM | POA: Diagnosis not present

## 2020-09-10 DIAGNOSIS — Z85819 Personal history of malignant neoplasm of unspecified site of lip, oral cavity, and pharynx: Secondary | ICD-10-CM | POA: Diagnosis not present

## 2020-09-10 DIAGNOSIS — Z882 Allergy status to sulfonamides status: Secondary | ICD-10-CM | POA: Diagnosis not present

## 2020-09-10 DIAGNOSIS — Z08 Encounter for follow-up examination after completed treatment for malignant neoplasm: Secondary | ICD-10-CM | POA: Diagnosis not present

## 2020-09-10 DIAGNOSIS — E039 Hypothyroidism, unspecified: Secondary | ICD-10-CM | POA: Diagnosis not present

## 2020-09-10 DIAGNOSIS — Z9221 Personal history of antineoplastic chemotherapy: Secondary | ICD-10-CM | POA: Diagnosis not present

## 2020-09-14 ENCOUNTER — Telehealth: Payer: Self-pay | Admitting: *Deleted

## 2020-09-14 ENCOUNTER — Ambulatory Visit (INDEPENDENT_AMBULATORY_CARE_PROVIDER_SITE_OTHER): Payer: Medicare HMO

## 2020-09-14 ENCOUNTER — Other Ambulatory Visit: Payer: Self-pay | Admitting: Cardiovascular Disease

## 2020-09-14 DIAGNOSIS — R002 Palpitations: Secondary | ICD-10-CM

## 2020-09-14 NOTE — Telephone Encounter (Signed)
Tests ordered and appt moved up.Pt aware someone from scheduling will be calling to schedule tests./cy

## 2020-09-14 NOTE — Progress Notes (Unsigned)
Patient enrolled for 2-3 day ZIO XT long term holter monitor to be shipped to patient.  Expedite shipping requested. Patient has follow up appointment 09/28/20.

## 2020-09-14 NOTE — Telephone Encounter (Signed)
-----   Message from Josue Hector, MD sent at 09/14/2020 10:00 AM EDT ----- Please add patient to my schedule 10:15 5/20.  Needs coronary calcium score, 48 hour monitor for palpitations and echo for palpitations before visit. New patient eval palpitations. Referred by Wilhemina Bonito and Lavone Orn He has appointment with [facility] in June can keep for now but will likely not need I spoke with patient at length on phone today

## 2020-09-18 ENCOUNTER — Other Ambulatory Visit: Payer: Self-pay

## 2020-09-18 ENCOUNTER — Ambulatory Visit (INDEPENDENT_AMBULATORY_CARE_PROVIDER_SITE_OTHER)
Admission: RE | Admit: 2020-09-18 | Discharge: 2020-09-18 | Disposition: A | Discharge status: Home / Self Care | Payer: Self-pay | Source: Ambulatory Visit | Attending: Cardiovascular Disease | Admitting: Cardiovascular Disease

## 2020-09-18 ENCOUNTER — Ambulatory Visit (HOSPITAL_COMMUNITY): Payer: Medicare HMO | Attending: Cardiology

## 2020-09-18 DIAGNOSIS — Z923 Personal history of irradiation: Secondary | ICD-10-CM | POA: Insufficient documentation

## 2020-09-18 DIAGNOSIS — R002 Palpitations: Secondary | ICD-10-CM

## 2020-09-18 DIAGNOSIS — Z9221 Personal history of antineoplastic chemotherapy: Secondary | ICD-10-CM | POA: Diagnosis not present

## 2020-09-18 DIAGNOSIS — Z85818 Personal history of malignant neoplasm of other sites of lip, oral cavity, and pharynx: Secondary | ICD-10-CM | POA: Diagnosis not present

## 2020-09-18 DIAGNOSIS — I358 Other nonrheumatic aortic valve disorders: Secondary | ICD-10-CM | POA: Insufficient documentation

## 2020-09-18 DIAGNOSIS — I517 Cardiomegaly: Secondary | ICD-10-CM

## 2020-09-18 LAB — ECHOCARDIOGRAM COMPLETE
Area-P 1/2: 2.51 cm2
S' Lateral: 3 cm

## 2020-09-19 ENCOUNTER — Other Ambulatory Visit: Payer: Medicare HMO

## 2020-09-21 NOTE — Progress Notes (Signed)
CARDIOLOGY CONSULT NOTE       Patient ID: Thomas Hendricks MRN: [id number] DOB/AGE: [birth year] 70 y.o.  Admit date: (Not on file) Referring Physician: Clinton Quant Primary Physician: Lavone Orn, MD Primary Cardiologist: New Reason for Consultation: Palpitations   Active Problems:   * No active hospital problems. 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 Denies excess ETOH, drugs, stimulants or coffee. They are not mae worse with exercise. No associated chest pain dyspnea syncope He has had basal tongue cancer with XRT RX at [facility] He is in remission This caused some hypothyroidism which he is on synthroid for with normal TSH 3.3 on 09/10/20    TTE reviewed from 09/18/20 EF 60-65% moderate LAE AV sclerosis I did  Note PVC;s on ECG tracing during exam   Calcium Score 09/18/20 only 36 which was 28 th percentile for age  Baseline ECG normal SR rate 62 QT 376   Monitor : 09/24/20 NSR average HR 59 bpm  PaC/PVC;s 1.2% total beats short bursts of SVT 8-15 beats nothing Sustained   He has occasional chest tightness with his palpitations Occasional tightness when he first walks up a hill Then it subsides    ROS All other systems reviewed and negative except as noted above  Past Medical History:  Diagnosis Date  . Allergic rhinitis   . Dysplastic skin lesion   . Esophageal reflux   . Primary cancer of base of tongue ([place]) 01/13/14   Left Base of Tongue    Family History  Problem Relation Age of Onset  . Prostate cancer Brother   . Deafness Other   . Osteoporosis Other   . Arthritis/Rheumatoid Mother   . Dementia Mother     Social History   Socioeconomic History  . Marital status: Married    Spouse name: Not on file  . Number of children: 2  . Years of education: Not on file  . Highest education level: Not on file  Occupational History  . Occupation: FULL TIME ATTORNEY  Tobacco  Use  . Smoking status: Never Smoker  . Smokeless tobacco: Never Used  Substance and Sexual Activity  . Alcohol use: Yes    Alcohol/week: 14.0 standard drinks    Types: 7 Glasses of wine, 7 Cans of beer per week  . Drug use: No  . Sexual activity: Not on file  Other Topics Concern  . Not on file  Social History Narrative   The patient is married. Patient has 2 daughters.   The patient has never smoked. Patient has never used smokeless tobacco.   Patient does drink wine beer on a weekly basis.   The patient is an Forensic psychologist at [employer].   Social Determinants of Health   Financial Resource Strain: Not on file  Food Insecurity: Not on file  Transportation Needs: Not on file  Physical Activity: Not on file  Stress: Not on file  Social Connections: Not on file  Intimate Partner Violence: Not on file    Past Surgical History:  Procedure Laterality Date  . Biopsy of  base of tongue Left 01/13/14  . COLONOSCOPY    . Oral surgery tooth extraction    . TONSILLECTOMY        Current Outpatient Medications:  .  cholecalciferol (VITAMIN D) 1000 UNITS tablet, Take 1,000 Units by mouth daily., Disp: , Rfl:  .  esomeprazole (NEXIUM) 40 MG capsule,  Take 20 mg by mouth 1 day or 1 dose. , Disp: , Rfl:  .  fluticasone (FLONASE) 50 MCG/ACT nasal spray, Place 1 spray into both nostrils daily as needed for allergies. 1 spray by Each Nare route daily., Disp: , Rfl:  .  levothyroxine (SYNTHROID, LEVOTHROID) 25 MCG tablet, Take 25 mcg by mouth daily before breakfast., Disp: , Rfl:  .  naproxen sodium (ANAPROX) 220 MG tablet, Take 220 mg by mouth 2 (two) times daily with a meal., Disp: , Rfl:  .  ondansetron (ZOFRAN) 8 MG tablet, Take 8 mg by mouth every 8 (eight) hours as needed for nausea or vomiting., Disp: , Rfl:  .  polyethylene glycol powder (GLYCOLAX/MIRALAX) powder, Take 17 g by mouth 2 (two) times daily. Until daily soft stools  OTC, Disp: 255 g, Rfl: 0 .  prochlorperazine (COMPAZINE) 10 MG tablet,  Take 10 mg by mouth every 6 (six) hours as needed for nausea or vomiting., Disp: , Rfl:  .  psyllium (METAMUCIL) 58.6 % powder, Take 1 packet by mouth daily., Disp: , Rfl:  .  senna-docusate (SENOKOT-S) 8.6-50 MG per tablet, Take 1 tablet by mouth daily., Disp: , Rfl:     Physical Exam: There were no vitals taken for this visit.   Affect appropriate Healthy:  appears stated age HEENT: normal Neck supple with no adenopathy JVP normal no bruits no thyromegaly Lungs clear with no wheezing and good diaphragmatic motion Heart:  S1/S2 no murmur, no rub, gallop or click PMI normal Abdomen: benighn, BS positve, no tenderness, no AAA no bruit.  No HSM or HJR Distal pulses intact with no bruits No edema Neuro non-focal Skin warm and dry No muscular weakness   Labs:   Lab Results  Component Value Date   HGB 16.0 01/23/2014   HCT 47.0 01/23/2014   No results for input(s): NA, K, CL, CO2, BUN, CREATININE, CALCIUM, PROT, BILITOT, ALKPHOS, ALT, AST, GLUCOSE in the last 168 hours.  Invalid input(s): LABALBU No results found for: CKTOTAL, CKMB, CKMBINDEX, TROPONINI No results found for: CHOL No results found for: HDL No results found for: LDLCALC No results found for: TRIG No results found for: CHOLHDL No results found for: LDLDIRECT    Radiology: CT CARDIAC SCORING (SELF PAY ONLY)  Addendum Date: 09/18/2020   ADDENDUM REPORT: 09/18/2020 12:36 CLINICAL DATA:  Cardiovascular Disease Risk stratification EXAM: Coronary Calcium Score TECHNIQUE: A gated, non-contrast computed tomography scan of the heart was performed using 3mm slice thickness. Axial images were analyzed on a dedicated workstation. Calcium scoring of the coronary arteries was performed using the Agatston method. FINDINGS: Coronary arteries: Normal origins. Coronary Calcium Score: Left main: 0 Left anterior descending artery: 8.73 Left circumflex artery: 0 Right coronary artery: 27.3 Total: 36 Percentile: 28th Pericardium:  Normal. Ascending Aorta: Normal caliber. Non-cardiac: [facility]. IMPRESSION: Coronary calcium score of 36. This was 28th percentile for age-, race-, and sex-matched controls. RECOMMENDATIONS: Coronary artery calcium (CAC) score is a strong predictor of incident coronary heart disease (CHD) and provides predictive information beyond traditional risk factors. CAC scoring is reasonable to use in the decision to withhold, postpone, or initiate statin therapy in intermediate-risk or selected borderline-risk asymptomatic adults (age 71-75 years and LDL-C >=70 to <190 mg/dL) who do not have diabetes or established atherosclerotic cardiovascular disease (ASCVD).* In intermediate-risk (10-year ASCVD risk >=7.5% to <20%) adults or selected borderline-risk (10-year ASCVD risk >=5% to <7.5%) adults in whom a CAC score is measured for the purpose  of making a treatment decision the following recommendations have been made: If CAC=0, it is reasonable to withhold statin therapy and reassess in 5 to 10 years, as long as higher risk conditions are absent (diabetes mellitus, family history of premature CHD in first degree relatives (males <55 years; females <65 years), cigarette smoking, or LDL >=190 mg/dL). If CAC is 1 to 99, it is reasonable to initiate statin therapy for patients >=50 years of age. If CAC is >=100 or >=75th percentile, it is reasonable to initiate statin therapy at any age. Cardiology referral should be considered for patients with CAC scores >=400 or >=75th percentile. *2018 AHA/ACC/AACVPR/AAPA/ABC/ACPM/ADA/AGS/APhA/ASPC/NLA/PCNA Guideline on the Management of Blood Cholesterol: A Report of the American College of Cardiology/American Heart Association Task Force on Clinical Practice Guidelines. J Am Coll Cardiol. 2019;73(24):3168-3209. Fransico Him, MD Electronically Signed   By: Fransico Him   On: 09/18/2020 12:36   Result Date: 09/18/2020 EXAM: OVER-READ INTERPRETATION  CT  CHEST The following report is an over-read performed by radiologist Dr. Rolm Baptise of [facility], [place] on 09/18/2020. This over-read does not include interpretation of cardiac or coronary anatomy or pathology. The coronary calcium score interpretation by the cardiologist is attached. COMPARISON:  None. FINDINGS: Vascular: Heart is normal size.  Aorta normal caliber. Mediastinum/Nodes: No adenopathy Lungs/Pleura: No confluent opacities or effusions. Upper Abdomen: Imaging into the upper abdomen demonstrates no acute findings. Musculoskeletal: Chest wall soft tissues are unremarkable. No acute bony abnormality. IMPRESSION: No acute or significant extracardiac abnormality. Electronically Signed: By: Rolm Baptise M.D. On: 09/18/2020 11:35   ECHOCARDIOGRAM COMPLETE  Result Date: 09/18/2020    ECHOCARDIOGRAM REPORT   Patient Name:   Thomas Hendricks Date of Exam: 09/18/2020 Medical Rec #:  [id number]         Height:       73.0 in Accession #:    [id number]        Weight:       165.0 lb Date of Birth:  08-11-1950         BSA:          1.983 m Patient Age:    32 years          BP:           125/66 mmHg Patient Gender: M                 HR:           59 bpm. Exam Location:  [place] Procedure: 2D Echo, 3D Echo, Cardiac Doppler and Color Doppler Indications:    R00.2 Palpitations  History:        Patient has no prior history of Echocardiogram examinations.                 Palpitations, History of Oropharyngeal Carcinoma with Chemo and                 Radiation (2015).                  Increased frequency of Palpitations following booster #2.  Sonographer:    Deliah Boston RDCS Referring Phys: [place]  1. Left ventricular ejection fraction, by estimation, is 60 to 65%. The left ventricle has normal function. The left ventricle has no regional wall motion abnormalities. Left ventricular diastolic parameters were normal.  2. Right ventricular systolic function is normal. The right  ventricular size is normal. There is normal pulmonary artery systolic pressure. The estimated  right ventricular systolic pressure is 95.6 mmHg.  3. Left atrial size was moderately dilated.  4. The mitral valve is normal in structure. No evidence of mitral valve regurgitation. No evidence of mitral stenosis.  5. The aortic valve is tricuspid. Aortic valve regurgitation is not visualized. Mild to moderate aortic valve sclerosis/calcification is present, without any evidence of aortic stenosis.  6. The inferior vena cava is normal in size with greater than 50% respiratory variability, suggesting right atrial pressure of 3 mmHg. FINDINGS  Left Ventricle: Left ventricular ejection fraction, by estimation, is 60 to 65%. The left ventricle has normal function. The left ventricle has no regional wall motion abnormalities. 3D left ventricular ejection fraction analysis performed but not reported based on interpreter judgement due to suboptimal quality. The left ventricular internal cavity size was normal in size. There is no left ventricular hypertrophy. Left ventricular diastolic parameters were normal. Right Ventricle: The right ventricular size is normal. No increase in right ventricular wall thickness. Right ventricular systolic function is normal. There is normal pulmonary artery systolic pressure. The tricuspid regurgitant velocity is 1.99 m/s, and  with an assumed right atrial pressure of 3 mmHg, the estimated right ventricular systolic pressure is 21.3 mmHg. Left Atrium: Left atrial size was moderately dilated. Right Atrium: Right atrial size was normal in size. Pericardium: There is no evidence of pericardial effusion. Mitral Valve: The mitral valve is normal in structure. No evidence of mitral valve regurgitation. No evidence of mitral valve stenosis. Tricuspid Valve: The tricuspid valve is normal in structure. Tricuspid valve regurgitation is trivial. Aortic Valve: The aortic valve is tricuspid. Aortic valve  regurgitation is not visualized. Mild to moderate aortic valve sclerosis/calcification is present, without any evidence of aortic stenosis. Pulmonic Valve: The pulmonic valve was not well visualized. Pulmonic valve regurgitation is not visualized. Aorta: The aortic root and ascending aorta are structurally normal, with no evidence of dilitation. Venous: The inferior vena cava is normal in size with greater than 50% respiratory variability, suggesting right atrial pressure of 3 mmHg. IAS/Shunts: The interatrial septum was not well visualized.  LEFT VENTRICLE PLAX 2D LVIDd:         5.30 cm  Diastology LVIDs:         3.00 cm  LV e' medial:    8.70 cm/s LV PW:         0.80 cm  LV E/e' medial:  11.0 LV IVS:        0.70 cm  LV e' lateral:   9.95 cm/s LVOT diam:     2.30 cm  LV E/e' lateral: 9.6 LV SV:         87 LV SV Index:   44 LVOT Area:     4.15 cm                          3D Volume EF:                         3D EF:        75 %                         LV EDV:       185 ml                         LV ESV:       47  ml                         LV SV:        138 ml RIGHT VENTRICLE RV S prime:     18.60 cm/s TAPSE (M-mode): 3.1 cm LEFT ATRIUM             Index       RIGHT ATRIUM           Index LA diam:        3.40 cm 1.71 cm/m  RA Area:     18.70 cm LA Vol (A2C):   76.1 ml 38.38 ml/m RA Volume:   51.30 ml  25.87 ml/m LA Vol (A4C):   96.8 ml 48.82 ml/m LA Biplane Vol: 92.1 ml 46.45 ml/m  AORTIC VALVE LVOT Vmax:   88.70 cm/s LVOT Vmean:  56.800 cm/s LVOT VTI:    0.209 m  AORTA Ao Root diam: 3.70 cm Ao Asc diam:  3.40 cm MITRAL VALVE               TRICUSPID VALVE MV Area (PHT): m          TR Peak grad:   15.8 mmHg MV Decel Time: 302 msec    TR Vmax:        199.00 cm/s MV E velocity: 96.00 cm/s MV A velocity: 66.80 cm/s  SHUNTS MV E/A ratio:  1.44        Systemic VTI:  0.21 m                            Systemic Diam: 2.30 cm Oswaldo Milian MD Electronically signed by Oswaldo Milian MD Signature Date/Time:  09/18/2020/3:47:27 PM    Final     EKG: SR rate 62 normal normal intervals    ASSESSMENT AND PLAN:   1. Palpitations:  No chest pain low calcium score echo with no structural heart disease and normal EF.   Monitor with benign PAC/PVC isolated short runs atrial tachycardia   2. Hypothyroid:  On synthroid with normal TSH  3. GERD:  Low carb diet PRN nexium  4. Cancer:;  Basal tongue post XRT f/u [facility] in remission   5. Chest Pain rare but related to exertion and palpitations PRN Inderal Even though calcium score is below Average will do f/u ETT r/o ischemia and exercise induced arrhythmias    ETT PRN Inderal   Signed: Jenkins Rouge 09/21/2020, 1:45 PM

## 2020-09-24 DIAGNOSIS — R002 Palpitations: Secondary | ICD-10-CM | POA: Diagnosis not present

## 2020-09-28 ENCOUNTER — Encounter: Payer: Self-pay | Admitting: Cardiovascular Disease

## 2020-09-28 ENCOUNTER — Ambulatory Visit: Payer: Medicare HMO | Admitting: Cardiovascular Disease

## 2020-09-28 ENCOUNTER — Encounter: Payer: Self-pay | Admitting: *Deleted

## 2020-09-28 ENCOUNTER — Other Ambulatory Visit: Payer: Self-pay

## 2020-09-28 VITALS — BP 102/72 | HR 62 | Ht 73.0 in | Wt 167.0 lb

## 2020-09-28 DIAGNOSIS — R079 Chest pain, unspecified: Secondary | ICD-10-CM | POA: Diagnosis not present

## 2020-09-28 DIAGNOSIS — E039 Hypothyroidism, unspecified: Secondary | ICD-10-CM

## 2020-09-28 DIAGNOSIS — R002 Palpitations: Secondary | ICD-10-CM

## 2020-09-28 DIAGNOSIS — K219 Gastro-esophageal reflux disease without esophagitis: Secondary | ICD-10-CM

## 2020-09-28 MED ORDER — PROPRANOLOL HCL 10 MG PO TABS: 10.0000 mg | ORAL_TABLET | ORAL | 2 refills | Status: DC | PRN

## 2020-09-28 NOTE — Patient Instructions (Signed)
Medication Instructions:  NO CHANGES *If you need a refill on your cardiac medications before your next appointment, please call your pharmacy*   Lab Work: NONE If you have labs (blood work) drawn today and your tests are completely normal, you will receive your results only by: Marland Kitchen MyChart Message (if you have MyChart) OR . A paper copy in the mail If you have any lab test that is abnormal or we need to change your treatment, we will call you to review the results.   Testing/Procedures: Your physician has requested that you have an exercise tolerance test. For further information please visit [email or web address]. Please also follow instruction sheet, as given.    Follow-Up: At [facility], you and your health needs are our priority.  As part of our continuing mission to provide you with exceptional heart care, we have created designated Provider Care Teams.  These Care Teams include your primary Cardiologist (physician) and Advanced Practice Providers (APPs -  Physician Assistants and Nurse Practitioners) who all work together to provide you with the care you need, when you need it.  We recommend signing up for the patient portal called "MyChart".  Sign up information is provided on this After Visit Summary.  MyChart is used to connect with patients for Virtual Visits (Telemedicine).  Patients are able to view lab/test results, encounter notes, upcoming appointments, etc.  Non-urgent messages can be sent to your provider as well.   To learn more about what you can do with MyChart, go to [email or web address].    Your next appointment:  PENDING TREADMILL FINDINGS  The format for your next appointment:   Provider:   Jenkins Rouge, MD   Other Instructions

## 2020-10-11 ENCOUNTER — Other Ambulatory Visit: Payer: Self-pay

## 2020-10-11 ENCOUNTER — Telehealth: Payer: Self-pay

## 2020-10-11 ENCOUNTER — Ambulatory Visit (INDEPENDENT_AMBULATORY_CARE_PROVIDER_SITE_OTHER): Payer: Medicare HMO

## 2020-10-11 DIAGNOSIS — R079 Chest pain, unspecified: Secondary | ICD-10-CM | POA: Diagnosis not present

## 2020-10-11 DIAGNOSIS — R002 Palpitations: Secondary | ICD-10-CM | POA: Diagnosis not present

## 2020-10-11 LAB — EXERCISE TOLERANCE TEST
Estimated workload: 10.1 METS
Exercise duration (min): 9 min
Exercise duration (sec): 0 s
MPHR: 149 {beats}/min
Peak HR: 134 {beats}/min
Percent HR: 89 %
RPE: 16
Rest HR: 59 {beats}/min

## 2020-10-11 NOTE — Telephone Encounter (Signed)
Attestation order placed for signing.

## 2020-10-15 ENCOUNTER — Telehealth: Payer: Self-pay | Admitting: *Deleted

## 2020-10-15 NOTE — Telephone Encounter (Signed)
-----   Message from Josue Hector, MD sent at 10/15/2020 10:55 AM EDT ----- Possibility that vaccine can cause some PVCls we certainly see arrhythmias in hospitalized patients Can take the inderal as needed  ----- Message ----- From: Willeen Cass, RN Sent: 10/15/2020  10:34 AM EDT To: Josue Hector, MD  Patient asking if there could be a relationship between covid or covid vaccine that would cause his PVC's?  Patient has propanolol but is afraid to take it because his heart rate is already in the 50's. Patient asking for advisement.  Thank you in advance for responding.

## 2020-10-15 NOTE — Telephone Encounter (Signed)
Spoke with patient about Dr. Kyla Balzarine response to his questions and patient was appreciative of the quick follow up.

## 2020-10-17 ENCOUNTER — Ambulatory Visit: Payer: Medicare HMO | Admitting: Cardiology

## 2020-11-15 DIAGNOSIS — H2513 Age-related nuclear cataract, bilateral: Secondary | ICD-10-CM | POA: Diagnosis not present

## 2021-01-10 DIAGNOSIS — C61 Malignant neoplasm of prostate: Secondary | ICD-10-CM | POA: Diagnosis not present

## 2021-01-10 DIAGNOSIS — Z8546 Personal history of malignant neoplasm of prostate: Secondary | ICD-10-CM | POA: Diagnosis not present

## 2021-02-05 DIAGNOSIS — R972 Elevated prostate specific antigen [PSA]: Secondary | ICD-10-CM | POA: Diagnosis not present

## 2021-02-05 DIAGNOSIS — C61 Malignant neoplasm of prostate: Secondary | ICD-10-CM | POA: Diagnosis not present

## 2021-02-12 DIAGNOSIS — D2271 Melanocytic nevi of right lower limb, including hip: Secondary | ICD-10-CM | POA: Diagnosis not present

## 2021-02-12 DIAGNOSIS — D485 Neoplasm of uncertain behavior of skin: Secondary | ICD-10-CM | POA: Diagnosis not present

## 2021-02-12 DIAGNOSIS — D224 Melanocytic nevi of scalp and neck: Secondary | ICD-10-CM | POA: Diagnosis not present

## 2021-02-12 DIAGNOSIS — D2261 Melanocytic nevi of right upper limb, including shoulder: Secondary | ICD-10-CM | POA: Diagnosis not present

## 2021-02-12 DIAGNOSIS — D1801 Hemangioma of skin and subcutaneous tissue: Secondary | ICD-10-CM | POA: Diagnosis not present

## 2021-02-12 DIAGNOSIS — D2272 Melanocytic nevi of left lower limb, including hip: Secondary | ICD-10-CM | POA: Diagnosis not present

## 2021-02-12 DIAGNOSIS — L821 Other seborrheic keratosis: Secondary | ICD-10-CM | POA: Diagnosis not present

## 2021-02-12 DIAGNOSIS — D2262 Melanocytic nevi of left upper limb, including shoulder: Secondary | ICD-10-CM | POA: Diagnosis not present

## 2021-02-12 DIAGNOSIS — L720 Epidermal cyst: Secondary | ICD-10-CM | POA: Diagnosis not present

## 2021-02-14 DIAGNOSIS — C61 Malignant neoplasm of prostate: Secondary | ICD-10-CM | POA: Diagnosis not present

## 2021-02-26 DIAGNOSIS — Z6822 Body mass index (BMI) 22.0-22.9, adult: Secondary | ICD-10-CM | POA: Diagnosis not present

## 2021-02-26 DIAGNOSIS — Z923 Personal history of irradiation: Secondary | ICD-10-CM | POA: Diagnosis not present

## 2021-02-26 DIAGNOSIS — C01 Malignant neoplasm of base of tongue: Secondary | ICD-10-CM | POA: Diagnosis not present

## 2021-03-05 DIAGNOSIS — C01 Malignant neoplasm of base of tongue: Secondary | ICD-10-CM | POA: Diagnosis not present

## 2021-03-05 DIAGNOSIS — Z923 Personal history of irradiation: Secondary | ICD-10-CM | POA: Diagnosis not present

## 2021-04-23 DIAGNOSIS — Z85810 Personal history of malignant neoplasm of tongue: Secondary | ICD-10-CM | POA: Diagnosis not present

## 2021-04-23 DIAGNOSIS — R1310 Dysphagia, unspecified: Secondary | ICD-10-CM | POA: Diagnosis not present

## 2021-04-23 DIAGNOSIS — I493 Ventricular premature depolarization: Secondary | ICD-10-CM | POA: Diagnosis not present

## 2021-04-23 DIAGNOSIS — R748 Abnormal levels of other serum enzymes: Secondary | ICD-10-CM | POA: Diagnosis not present

## 2021-04-23 DIAGNOSIS — C61 Malignant neoplasm of prostate: Secondary | ICD-10-CM | POA: Diagnosis not present

## 2021-04-23 DIAGNOSIS — Z0000 Encounter for general adult medical examination without abnormal findings: Secondary | ICD-10-CM | POA: Diagnosis not present

## 2021-04-23 DIAGNOSIS — E890 Postprocedural hypothyroidism: Secondary | ICD-10-CM | POA: Diagnosis not present

## 2021-04-23 DIAGNOSIS — R0989 Other specified symptoms and signs involving the circulatory and respiratory systems: Secondary | ICD-10-CM | POA: Diagnosis not present

## 2021-04-23 DIAGNOSIS — Z1211 Encounter for screening for malignant neoplasm of colon: Secondary | ICD-10-CM | POA: Diagnosis not present

## 2021-04-26 DIAGNOSIS — Z1211 Encounter for screening for malignant neoplasm of colon: Secondary | ICD-10-CM | POA: Diagnosis not present

## 2021-08-14 DIAGNOSIS — D2271 Melanocytic nevi of right lower limb, including hip: Secondary | ICD-10-CM | POA: Diagnosis not present

## 2021-08-14 DIAGNOSIS — D2371 Other benign neoplasm of skin of right lower limb, including hip: Secondary | ICD-10-CM | POA: Diagnosis not present

## 2021-08-14 DIAGNOSIS — D225 Melanocytic nevi of trunk: Secondary | ICD-10-CM | POA: Diagnosis not present

## 2021-08-14 DIAGNOSIS — L570 Actinic keratosis: Secondary | ICD-10-CM | POA: Diagnosis not present

## 2021-08-14 DIAGNOSIS — L814 Other melanin hyperpigmentation: Secondary | ICD-10-CM | POA: Diagnosis not present

## 2021-08-14 DIAGNOSIS — D1801 Hemangioma of skin and subcutaneous tissue: Secondary | ICD-10-CM | POA: Diagnosis not present

## 2021-08-14 DIAGNOSIS — D2272 Melanocytic nevi of left lower limb, including hip: Secondary | ICD-10-CM | POA: Diagnosis not present

## 2021-08-14 DIAGNOSIS — L821 Other seborrheic keratosis: Secondary | ICD-10-CM | POA: Diagnosis not present

## 2021-08-21 DIAGNOSIS — Z85819 Personal history of malignant neoplasm of unspecified site of lip, oral cavity, and pharynx: Secondary | ICD-10-CM | POA: Diagnosis not present

## 2021-08-21 DIAGNOSIS — Z79890 Hormone replacement therapy (postmenopausal): Secondary | ICD-10-CM | POA: Diagnosis not present

## 2021-08-21 DIAGNOSIS — C61 Malignant neoplasm of prostate: Secondary | ICD-10-CM | POA: Diagnosis not present

## 2021-08-21 DIAGNOSIS — E039 Hypothyroidism, unspecified: Secondary | ICD-10-CM | POA: Diagnosis not present

## 2021-08-21 DIAGNOSIS — Z8042 Family history of malignant neoplasm of prostate: Secondary | ICD-10-CM | POA: Diagnosis not present

## 2021-09-26 DIAGNOSIS — C61 Malignant neoplasm of prostate: Secondary | ICD-10-CM | POA: Diagnosis not present

## 2021-11-20 DIAGNOSIS — H2513 Age-related nuclear cataract, bilateral: Secondary | ICD-10-CM | POA: Diagnosis not present

## 2021-11-20 DIAGNOSIS — H40013 Open angle with borderline findings, low risk, bilateral: Secondary | ICD-10-CM | POA: Diagnosis not present

## 2022-01-15 DIAGNOSIS — Z6822 Body mass index (BMI) 22.0-22.9, adult: Secondary | ICD-10-CM | POA: Diagnosis not present

## 2022-01-15 DIAGNOSIS — C61 Malignant neoplasm of prostate: Secondary | ICD-10-CM | POA: Diagnosis not present

## 2022-02-06 DIAGNOSIS — C61 Malignant neoplasm of prostate: Secondary | ICD-10-CM | POA: Diagnosis not present

## 2022-02-13 DIAGNOSIS — D2272 Melanocytic nevi of left lower limb, including hip: Secondary | ICD-10-CM | POA: Diagnosis not present

## 2022-02-13 DIAGNOSIS — L821 Other seborrheic keratosis: Secondary | ICD-10-CM | POA: Diagnosis not present

## 2022-02-13 DIAGNOSIS — D224 Melanocytic nevi of scalp and neck: Secondary | ICD-10-CM | POA: Diagnosis not present

## 2022-02-13 DIAGNOSIS — L814 Other melanin hyperpigmentation: Secondary | ICD-10-CM | POA: Diagnosis not present

## 2022-02-13 DIAGNOSIS — D2271 Melanocytic nevi of right lower limb, including hip: Secondary | ICD-10-CM | POA: Diagnosis not present

## 2022-02-13 DIAGNOSIS — L570 Actinic keratosis: Secondary | ICD-10-CM | POA: Diagnosis not present

## 2022-02-13 DIAGNOSIS — D1801 Hemangioma of skin and subcutaneous tissue: Secondary | ICD-10-CM | POA: Diagnosis not present

## 2022-02-13 DIAGNOSIS — D2261 Melanocytic nevi of right upper limb, including shoulder: Secondary | ICD-10-CM | POA: Diagnosis not present

## 2022-02-13 DIAGNOSIS — D2371 Other benign neoplasm of skin of right lower limb, including hip: Secondary | ICD-10-CM | POA: Diagnosis not present

## 2022-02-13 DIAGNOSIS — D2262 Melanocytic nevi of left upper limb, including shoulder: Secondary | ICD-10-CM | POA: Diagnosis not present

## 2022-02-26 DIAGNOSIS — C61 Malignant neoplasm of prostate: Secondary | ICD-10-CM | POA: Diagnosis not present

## 2022-05-07 DIAGNOSIS — M25511 Pain in right shoulder: Secondary | ICD-10-CM | POA: Diagnosis not present

## 2022-05-07 DIAGNOSIS — M7501 Adhesive capsulitis of right shoulder: Secondary | ICD-10-CM | POA: Diagnosis not present

## 2022-05-26 DIAGNOSIS — M25511 Pain in right shoulder: Secondary | ICD-10-CM | POA: Diagnosis not present

## 2022-05-26 DIAGNOSIS — M7501 Adhesive capsulitis of right shoulder: Secondary | ICD-10-CM | POA: Diagnosis not present

## 2022-05-26 DIAGNOSIS — R69 Illness, unspecified: Secondary | ICD-10-CM | POA: Diagnosis not present

## 2022-06-03 DIAGNOSIS — M25511 Pain in right shoulder: Secondary | ICD-10-CM | POA: Diagnosis not present

## 2022-06-03 DIAGNOSIS — M7501 Adhesive capsulitis of right shoulder: Secondary | ICD-10-CM | POA: Diagnosis not present

## 2022-06-06 DIAGNOSIS — M25511 Pain in right shoulder: Secondary | ICD-10-CM | POA: Diagnosis not present

## 2022-06-06 DIAGNOSIS — M7501 Adhesive capsulitis of right shoulder: Secondary | ICD-10-CM | POA: Diagnosis not present

## 2022-06-10 DIAGNOSIS — M7501 Adhesive capsulitis of right shoulder: Secondary | ICD-10-CM | POA: Diagnosis not present

## 2022-06-10 DIAGNOSIS — M25511 Pain in right shoulder: Secondary | ICD-10-CM | POA: Diagnosis not present

## 2022-06-13 DIAGNOSIS — M25511 Pain in right shoulder: Secondary | ICD-10-CM | POA: Diagnosis not present

## 2022-06-13 DIAGNOSIS — M7501 Adhesive capsulitis of right shoulder: Secondary | ICD-10-CM | POA: Diagnosis not present

## 2022-06-17 DIAGNOSIS — M25511 Pain in right shoulder: Secondary | ICD-10-CM | POA: Diagnosis not present

## 2022-06-17 DIAGNOSIS — M7501 Adhesive capsulitis of right shoulder: Secondary | ICD-10-CM | POA: Diagnosis not present

## 2022-06-20 DIAGNOSIS — M7501 Adhesive capsulitis of right shoulder: Secondary | ICD-10-CM | POA: Diagnosis not present

## 2022-06-20 DIAGNOSIS — M25511 Pain in right shoulder: Secondary | ICD-10-CM | POA: Diagnosis not present

## 2022-06-24 DIAGNOSIS — M7501 Adhesive capsulitis of right shoulder: Secondary | ICD-10-CM | POA: Diagnosis not present

## 2022-06-24 DIAGNOSIS — M25511 Pain in right shoulder: Secondary | ICD-10-CM | POA: Diagnosis not present

## 2022-07-01 DIAGNOSIS — M7501 Adhesive capsulitis of right shoulder: Secondary | ICD-10-CM | POA: Diagnosis not present

## 2022-07-01 DIAGNOSIS — M25511 Pain in right shoulder: Secondary | ICD-10-CM | POA: Diagnosis not present

## 2022-07-03 DIAGNOSIS — Z1331 Encounter for screening for depression: Secondary | ICD-10-CM | POA: Diagnosis not present

## 2022-07-03 DIAGNOSIS — R1310 Dysphagia, unspecified: Secondary | ICD-10-CM | POA: Diagnosis not present

## 2022-07-03 DIAGNOSIS — Z85810 Personal history of malignant neoplasm of tongue: Secondary | ICD-10-CM | POA: Diagnosis not present

## 2022-07-03 DIAGNOSIS — Z0000 Encounter for general adult medical examination without abnormal findings: Secondary | ICD-10-CM | POA: Diagnosis not present

## 2022-07-03 DIAGNOSIS — Z1211 Encounter for screening for malignant neoplasm of colon: Secondary | ICD-10-CM | POA: Diagnosis not present

## 2022-07-03 DIAGNOSIS — C61 Malignant neoplasm of prostate: Secondary | ICD-10-CM | POA: Diagnosis not present

## 2022-07-03 DIAGNOSIS — E7800 Pure hypercholesterolemia, unspecified: Secondary | ICD-10-CM | POA: Diagnosis not present

## 2022-07-03 DIAGNOSIS — E890 Postprocedural hypothyroidism: Secondary | ICD-10-CM | POA: Diagnosis not present

## 2022-07-03 DIAGNOSIS — R748 Abnormal levels of other serum enzymes: Secondary | ICD-10-CM | POA: Diagnosis not present

## 2022-07-03 DIAGNOSIS — R0989 Other specified symptoms and signs involving the circulatory and respiratory systems: Secondary | ICD-10-CM | POA: Diagnosis not present

## 2022-07-03 DIAGNOSIS — I493 Ventricular premature depolarization: Secondary | ICD-10-CM | POA: Diagnosis not present

## 2022-07-03 DIAGNOSIS — Z5181 Encounter for therapeutic drug level monitoring: Secondary | ICD-10-CM | POA: Diagnosis not present

## 2022-07-15 DIAGNOSIS — M25511 Pain in right shoulder: Secondary | ICD-10-CM | POA: Diagnosis not present

## 2022-07-15 DIAGNOSIS — M7501 Adhesive capsulitis of right shoulder: Secondary | ICD-10-CM | POA: Diagnosis not present

## 2022-07-20 DIAGNOSIS — R69 Illness, unspecified: Secondary | ICD-10-CM | POA: Diagnosis not present

## 2022-07-21 DIAGNOSIS — R69 Illness, unspecified: Secondary | ICD-10-CM | POA: Diagnosis not present

## 2022-07-22 DIAGNOSIS — R69 Illness, unspecified: Secondary | ICD-10-CM | POA: Diagnosis not present

## 2022-08-08 DIAGNOSIS — K4020 Bilateral inguinal hernia, without obstruction or gangrene, not specified as recurrent: Secondary | ICD-10-CM | POA: Diagnosis not present

## 2022-08-08 DIAGNOSIS — C61 Malignant neoplasm of prostate: Secondary | ICD-10-CM | POA: Diagnosis not present

## 2022-08-20 DIAGNOSIS — D2271 Melanocytic nevi of right lower limb, including hip: Secondary | ICD-10-CM | POA: Diagnosis not present

## 2022-08-20 DIAGNOSIS — D2272 Melanocytic nevi of left lower limb, including hip: Secondary | ICD-10-CM | POA: Diagnosis not present

## 2022-08-20 DIAGNOSIS — D1801 Hemangioma of skin and subcutaneous tissue: Secondary | ICD-10-CM | POA: Diagnosis not present

## 2022-08-20 DIAGNOSIS — D2371 Other benign neoplasm of skin of right lower limb, including hip: Secondary | ICD-10-CM | POA: Diagnosis not present

## 2022-08-20 DIAGNOSIS — L723 Sebaceous cyst: Secondary | ICD-10-CM | POA: Diagnosis not present

## 2022-08-20 DIAGNOSIS — L570 Actinic keratosis: Secondary | ICD-10-CM | POA: Diagnosis not present

## 2022-08-20 DIAGNOSIS — L821 Other seborrheic keratosis: Secondary | ICD-10-CM | POA: Diagnosis not present

## 2022-08-26 DIAGNOSIS — C01 Malignant neoplasm of base of tongue: Secondary | ICD-10-CM | POA: Diagnosis not present

## 2023-01-01 DIAGNOSIS — K648 Other hemorrhoids: Secondary | ICD-10-CM | POA: Diagnosis not present

## 2023-01-01 DIAGNOSIS — D123 Benign neoplasm of transverse colon: Secondary | ICD-10-CM | POA: Diagnosis not present

## 2023-01-01 DIAGNOSIS — Z83719 Family history of colon polyps, unspecified: Secondary | ICD-10-CM | POA: Diagnosis not present

## 2023-01-01 DIAGNOSIS — Z1211 Encounter for screening for malignant neoplasm of colon: Secondary | ICD-10-CM | POA: Diagnosis not present

## 2023-01-01 DIAGNOSIS — K5730 Diverticulosis of large intestine without perforation or abscess without bleeding: Secondary | ICD-10-CM | POA: Diagnosis not present

## 2023-01-05 DIAGNOSIS — D123 Benign neoplasm of transverse colon: Secondary | ICD-10-CM | POA: Diagnosis not present

## 2023-01-19 DIAGNOSIS — M79645 Pain in left finger(s): Secondary | ICD-10-CM | POA: Diagnosis not present

## 2023-01-19 DIAGNOSIS — M65842 Other synovitis and tenosynovitis, left hand: Secondary | ICD-10-CM | POA: Diagnosis not present

## 2023-01-20 DIAGNOSIS — H40023 Open angle with borderline findings, high risk, bilateral: Secondary | ICD-10-CM | POA: Diagnosis not present

## 2023-01-20 DIAGNOSIS — Z961 Presence of intraocular lens: Secondary | ICD-10-CM | POA: Diagnosis not present

## 2023-02-11 DIAGNOSIS — C61 Malignant neoplasm of prostate: Secondary | ICD-10-CM | POA: Diagnosis not present

## 2023-02-11 DIAGNOSIS — R972 Elevated prostate specific antigen [PSA]: Secondary | ICD-10-CM | POA: Diagnosis not present

## 2023-02-17 DIAGNOSIS — L820 Inflamed seborrheic keratosis: Secondary | ICD-10-CM | POA: Diagnosis not present

## 2023-02-17 DIAGNOSIS — D2261 Melanocytic nevi of right upper limb, including shoulder: Secondary | ICD-10-CM | POA: Diagnosis not present

## 2023-02-17 DIAGNOSIS — L821 Other seborrheic keratosis: Secondary | ICD-10-CM | POA: Diagnosis not present

## 2023-02-17 DIAGNOSIS — D2271 Melanocytic nevi of right lower limb, including hip: Secondary | ICD-10-CM | POA: Diagnosis not present

## 2023-02-17 DIAGNOSIS — D2272 Melanocytic nevi of left lower limb, including hip: Secondary | ICD-10-CM | POA: Diagnosis not present

## 2023-02-17 DIAGNOSIS — L570 Actinic keratosis: Secondary | ICD-10-CM | POA: Diagnosis not present

## 2023-02-17 DIAGNOSIS — D2371 Other benign neoplasm of skin of right lower limb, including hip: Secondary | ICD-10-CM | POA: Diagnosis not present

## 2023-02-17 DIAGNOSIS — D2262 Melanocytic nevi of left upper limb, including shoulder: Secondary | ICD-10-CM | POA: Diagnosis not present

## 2023-02-17 DIAGNOSIS — D224 Melanocytic nevi of scalp and neck: Secondary | ICD-10-CM | POA: Diagnosis not present

## 2023-02-19 DIAGNOSIS — C61 Malignant neoplasm of prostate: Secondary | ICD-10-CM | POA: Diagnosis not present

## 2023-02-19 DIAGNOSIS — R972 Elevated prostate specific antigen [PSA]: Secondary | ICD-10-CM | POA: Diagnosis not present

## 2023-04-13 DIAGNOSIS — J189 Pneumonia, unspecified organism: Secondary | ICD-10-CM | POA: Diagnosis not present

## 2023-04-13 DIAGNOSIS — R051 Acute cough: Secondary | ICD-10-CM | POA: Diagnosis not present

## 2023-04-18 DIAGNOSIS — J189 Pneumonia, unspecified organism: Secondary | ICD-10-CM | POA: Diagnosis not present

## 2023-05-18 DIAGNOSIS — C61 Malignant neoplasm of prostate: Secondary | ICD-10-CM | POA: Diagnosis not present

## 2023-05-21 DIAGNOSIS — R972 Elevated prostate specific antigen [PSA]: Secondary | ICD-10-CM | POA: Diagnosis not present

## 2023-06-05 DIAGNOSIS — Z20822 Contact with and (suspected) exposure to covid-19: Secondary | ICD-10-CM | POA: Diagnosis not present

## 2023-06-05 DIAGNOSIS — R509 Fever, unspecified: Secondary | ICD-10-CM | POA: Diagnosis not present

## 2023-06-05 DIAGNOSIS — J0110 Acute frontal sinusitis, unspecified: Secondary | ICD-10-CM | POA: Diagnosis not present

## 2023-06-05 DIAGNOSIS — H60312 Diffuse otitis externa, left ear: Secondary | ICD-10-CM | POA: Diagnosis not present

## 2023-07-08 DIAGNOSIS — E890 Postprocedural hypothyroidism: Secondary | ICD-10-CM | POA: Diagnosis not present

## 2023-07-08 DIAGNOSIS — Z5181 Encounter for therapeutic drug level monitoring: Secondary | ICD-10-CM | POA: Diagnosis not present

## 2023-07-08 DIAGNOSIS — E7800 Pure hypercholesterolemia, unspecified: Secondary | ICD-10-CM | POA: Diagnosis not present

## 2023-07-08 DIAGNOSIS — Z0000 Encounter for general adult medical examination without abnormal findings: Secondary | ICD-10-CM | POA: Diagnosis not present

## 2023-07-08 DIAGNOSIS — Z1331 Encounter for screening for depression: Secondary | ICD-10-CM | POA: Diagnosis not present

## 2023-07-28 DIAGNOSIS — H401131 Primary open-angle glaucoma, bilateral, mild stage: Secondary | ICD-10-CM | POA: Diagnosis not present

## 2023-08-12 DIAGNOSIS — H401321 Pigmentary glaucoma, left eye, mild stage: Secondary | ICD-10-CM | POA: Diagnosis not present

## 2023-08-18 DIAGNOSIS — L814 Other melanin hyperpigmentation: Secondary | ICD-10-CM | POA: Diagnosis not present

## 2023-08-18 DIAGNOSIS — L905 Scar conditions and fibrosis of skin: Secondary | ICD-10-CM | POA: Diagnosis not present

## 2023-08-18 DIAGNOSIS — D225 Melanocytic nevi of trunk: Secondary | ICD-10-CM | POA: Diagnosis not present

## 2023-08-18 DIAGNOSIS — L570 Actinic keratosis: Secondary | ICD-10-CM | POA: Diagnosis not present

## 2023-08-18 DIAGNOSIS — D2371 Other benign neoplasm of skin of right lower limb, including hip: Secondary | ICD-10-CM | POA: Diagnosis not present

## 2023-08-18 DIAGNOSIS — L821 Other seborrheic keratosis: Secondary | ICD-10-CM | POA: Diagnosis not present

## 2023-08-19 DIAGNOSIS — H401311 Pigmentary glaucoma, right eye, mild stage: Secondary | ICD-10-CM | POA: Diagnosis not present

## 2023-08-26 DIAGNOSIS — Z91040 Latex allergy status: Secondary | ICD-10-CM | POA: Diagnosis not present

## 2023-08-26 DIAGNOSIS — Z85810 Personal history of malignant neoplasm of tongue: Secondary | ICD-10-CM | POA: Diagnosis not present

## 2023-08-26 DIAGNOSIS — Z923 Personal history of irradiation: Secondary | ICD-10-CM | POA: Diagnosis not present

## 2023-08-26 DIAGNOSIS — K219 Gastro-esophageal reflux disease without esophagitis: Secondary | ICD-10-CM | POA: Diagnosis not present

## 2023-08-26 DIAGNOSIS — Z08 Encounter for follow-up examination after completed treatment for malignant neoplasm: Secondary | ICD-10-CM | POA: Diagnosis not present

## 2023-08-26 DIAGNOSIS — C01 Malignant neoplasm of base of tongue: Secondary | ICD-10-CM | POA: Diagnosis not present

## 2023-08-26 DIAGNOSIS — Z882 Allergy status to sulfonamides status: Secondary | ICD-10-CM | POA: Diagnosis not present

## 2023-08-26 DIAGNOSIS — Z79890 Hormone replacement therapy (postmenopausal): Secondary | ICD-10-CM | POA: Diagnosis not present

## 2023-08-26 DIAGNOSIS — Z91048 Other nonmedicinal substance allergy status: Secondary | ICD-10-CM | POA: Diagnosis not present

## 2023-08-26 DIAGNOSIS — Z9221 Personal history of antineoplastic chemotherapy: Secondary | ICD-10-CM | POA: Diagnosis not present

## 2023-11-20 DIAGNOSIS — R3915 Urgency of urination: Secondary | ICD-10-CM | POA: Diagnosis not present

## 2023-11-20 DIAGNOSIS — Z79899 Other long term (current) drug therapy: Secondary | ICD-10-CM | POA: Diagnosis not present

## 2023-11-20 DIAGNOSIS — Z923 Personal history of irradiation: Secondary | ICD-10-CM | POA: Diagnosis not present

## 2023-11-20 DIAGNOSIS — K219 Gastro-esophageal reflux disease without esophagitis: Secondary | ICD-10-CM | POA: Diagnosis not present

## 2023-11-20 DIAGNOSIS — N401 Enlarged prostate with lower urinary tract symptoms: Secondary | ICD-10-CM | POA: Diagnosis not present

## 2023-11-20 DIAGNOSIS — Z85819 Personal history of malignant neoplasm of unspecified site of lip, oral cavity, and pharynx: Secondary | ICD-10-CM | POA: Diagnosis not present

## 2023-11-20 DIAGNOSIS — C61 Malignant neoplasm of prostate: Secondary | ICD-10-CM | POA: Diagnosis not present

## 2023-11-20 DIAGNOSIS — Z9221 Personal history of antineoplastic chemotherapy: Secondary | ICD-10-CM | POA: Diagnosis not present

## 2023-11-20 DIAGNOSIS — K5900 Constipation, unspecified: Secondary | ICD-10-CM | POA: Diagnosis not present

## 2023-11-20 DIAGNOSIS — Z8042 Family history of malignant neoplasm of prostate: Secondary | ICD-10-CM | POA: Diagnosis not present

## 2023-11-20 DIAGNOSIS — R351 Nocturia: Secondary | ICD-10-CM | POA: Diagnosis not present

## 2024-02-17 DIAGNOSIS — D224 Melanocytic nevi of scalp and neck: Secondary | ICD-10-CM | POA: Diagnosis not present

## 2024-02-17 DIAGNOSIS — L905 Scar conditions and fibrosis of skin: Secondary | ICD-10-CM | POA: Diagnosis not present

## 2024-02-17 DIAGNOSIS — L738 Other specified follicular disorders: Secondary | ICD-10-CM | POA: Diagnosis not present

## 2024-02-17 DIAGNOSIS — L814 Other melanin hyperpigmentation: Secondary | ICD-10-CM | POA: Diagnosis not present

## 2024-02-17 DIAGNOSIS — B360 Pityriasis versicolor: Secondary | ICD-10-CM | POA: Diagnosis not present

## 2024-02-17 DIAGNOSIS — L723 Sebaceous cyst: Secondary | ICD-10-CM | POA: Diagnosis not present

## 2024-02-17 DIAGNOSIS — D485 Neoplasm of uncertain behavior of skin: Secondary | ICD-10-CM | POA: Diagnosis not present

## 2024-02-17 DIAGNOSIS — L820 Inflamed seborrheic keratosis: Secondary | ICD-10-CM | POA: Diagnosis not present

## 2024-02-17 DIAGNOSIS — C44712 Basal cell carcinoma of skin of right lower limb, including hip: Secondary | ICD-10-CM | POA: Diagnosis not present

## 2024-02-17 DIAGNOSIS — L570 Actinic keratosis: Secondary | ICD-10-CM | POA: Diagnosis not present

## 2024-02-17 DIAGNOSIS — L821 Other seborrheic keratosis: Secondary | ICD-10-CM | POA: Diagnosis not present

## 2024-05-30 NOTE — Progress Notes (Signed)
 CARDIOLOGY CONSULT NOTE       Patient ID: Thomas Hendricks MRN: [id number] DOB/AGE: December 11, 1950 74 y.o.  Referring Physician: Self Primary Physician: Charlott Dorn LABOR, MD Primary Cardiologist: New Reason for Consultation: CAD/Palpitations    HPI:  74 y.o. self referred back for heart evaluation. Last seen by me in 2022 for palpitations. History of tongue cancer with XRT at [facility] and hypothyroidism on synthroid. Monitor at that time with average HR 59 bpm and PAC/PVC 1.2% short bursts PSVT 8-15 beats max  Echo 09/18/20 normal EF 60-65% no significant valve dx AV sclerosis. Coronary calcium score low for age at 1, 72 th percentile. ETT 10/11/20 normal Did 10 METS with occasional PVC;s with stress and in recovery.  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 Drinks a beer daily Walks 30 minutes most days. He lives on [address] near my friend Rolan Molt local dermatologist. He also did his undergrad work at [employer] as did I. His daughter works with him He has had some SSCP with walking Not consistent no rest pain and mil. No associated dyspnea, or syncope he has some chronic benign palpitations   He is being followed by urology for prostate cancer     ROS All other systems reviewed and negative except as noted above  Past Medical History:  Diagnosis Date   Allergic rhinitis    Dysplastic skin lesion    Esophageal reflux    Primary cancer of base of tongue ([facility]) 01/13/14   Left Base of Tongue    Family History  Problem Relation Age of Onset   Prostate cancer Brother    Deafness Other    Osteoporosis Other    Arthritis/Rheumatoid Mother    Dementia Mother     Social History   Socioeconomic History   Marital status: Married    Spouse name: Not on file   Number of children: 2   Years of education: Not on file   Highest education level: Not on file   Occupational History   Occupation: FULL TIME ATTORNEY  Tobacco Use   Smoking status: Never   Smokeless tobacco: Never  Substance and Sexual Activity   Alcohol use: Yes    Alcohol/week: 14.0 standard drinks of alcohol    Types: 7 Glasses of wine, 7 Cans of beer per week   Drug use: No   Sexual activity: Not on file  Other Topics Concern   Not on file  Social History Narrative   The patient is married. Patient has 2 daughters.   The patient has never smoked. Patient has never used smokeless tobacco.   Patient does drink wine beer on a weekly basis.   The patient is an pensions consultant at [employer].   Social Drivers of Health   Tobacco Use: Low Risk (06/08/2024)   Patient History    Smoking Tobacco Use: Never    Smokeless Tobacco Use: Never    Passive Exposure: Not on file  Financial Resource Strain: Not on file  Food Insecurity: No Food Insecurity (08/26/2023)   Received from [facility]   Epic    Within the past 12 months, you worried that your food would run out before you got the money to buy more.: Never true    Within the past 12 months, the food you bought just didn't last and you didn't have money to get more.:  Never true  Transportation Needs: No Transportation Needs (08/26/2023)   Received from [facility] - Transportation    Lack of Transportation (Medical): No    Lack of Transportation (Non-Medical): No  Physical Activity: Not on file  Stress: Not on file  Social Connections: Not on file  Intimate Partner Violence: Not At Risk (08/26/2023)   Received from [facility]   Epic    Within the last year, have you been afraid of your partner or ex-partner?: No    Within the last year, have you been humiliated or emotionally abused in other ways by your partner or ex-partner?: No    Within the last year, have you been kicked, hit, slapped, or otherwise physically hurt by your partner or ex-partner?: No    Within the last year, have you been raped or forced to have  any kind of sexual activity by your partner or ex-partner?: No  Depression (PHQ2-9): Not on file  Alcohol Screen: Not on file  Housing: Not on file  Utilities: Low Risk (08/26/2023)   Received from [facility]   Utilities    Within the past 12 months, have you been unable to get utilities(heat, electricity) when it was really needed?: No  Health Literacy: Not on file    Past Surgical History:  Procedure Laterality Date   Biopsy of  base of tongue Left 01/13/14   COLONOSCOPY     Oral surgery tooth extraction     TONSILLECTOMY       Current Medications[1]    Physical Exam: Blood pressure 110/60, pulse (!) 55, height 6' 1 (1.854 m), weight 169 lb 11.2 oz (77 kg), SpO2 98%.    Affect appropriate Healthy:  appears stated age HEENT: normal Neck supple with no adenopathy JVP normal no bruits no thyromegaly Lungs clear with no wheezing and good diaphragmatic motion Heart:  S1/S2 no murmur, no rub, gallop or click PMI normal Abdomen: benighn, BS positve, no tenderness, no AAA no bruit.  No HSM or HJR Distal pulses intact with no bruits No edema Neuro non-focal Skin warm and dry No muscular weakness   Labs:   Lab Results  Component Value Date   HGB 16.0 01/23/2014   HCT 47.0 01/23/2014     Radiology: No results found.  EKG: SR rate 55 normal    ASSESSMENT AND PLAN:   Palpitations: benign prior monitor mainly PAC;s No prior clinical CAD or structural heart DX Baseline ECG normal Prostate Cancer:  f/u with urology PSA stable Tongue Cancer:  f/u at [facility] XRT many years ago  Hypothyroidism:  on synthroid replacement TSH normal  GERD:  low carb diet on prilosec  CAD: calcium score done 09/18/20 only 36 , 28 th percentile with calcium noted in LAD and RCA Will update calcium score Given mild SSCP and normal ECG will order ETT ENT:  recent molar extraction On [place] to help healing given prior XRT for cancer  Calcium Score ETT  F/U in a year    Signed: Maude Emmer 06/08/2024, 3:41 PM       [1]  Current Outpatient Medications:    docusate sodium (COLACE) 100 MG capsule, Take 100 mg by mouth daily., Disp: , Rfl:    fluticasone (FLONASE) 50 MCG/ACT nasal spray, Place 1 spray into both nostrils daily as needed for allergies. 1 spray by Each Nare route daily., Disp: , Rfl:    ibuprofen (ADVIL) 200 MG tablet, Take 400 mg by mouth as  needed for pain., Disp: , Rfl:    levothyroxine (SYNTHROID) 100 MCG tablet, Take 100 mcg by mouth daily., Disp: , Rfl:    naproxen sodium (ANAPROX) 220 MG tablet, Take 220 mg by mouth 2 (two) times daily with a meal., Disp: , Rfl:    pentoxifylline (TRENTAL) 400 MG CR tablet, Take 400 mg by mouth 3 (three) times daily with meals., Disp: , Rfl:    psyllium (METAMUCIL) 58.6 % powder, Take 1 packet by mouth daily., Disp: , Rfl:    sodium fluoride  (FLUORISHIELD) 1.1 % GEL dental gel, 1 application daily. Instill one drop of gel per tooth space of fluoride  tray. Place over teeth for 5 minutes. Remove. Spit out excess. Repeat nightly, Disp: , Rfl:

## 2024-06-08 ENCOUNTER — Encounter: Payer: Self-pay | Admitting: Cardiovascular Disease

## 2024-06-08 ENCOUNTER — Ambulatory Visit: Attending: Cardiovascular Disease | Admitting: Cardiovascular Disease

## 2024-06-08 VITALS — BP 110/60 | HR 55 | Ht 73.0 in | Wt 169.7 lb

## 2024-06-08 DIAGNOSIS — C01 Malignant neoplasm of base of tongue: Secondary | ICD-10-CM

## 2024-06-08 DIAGNOSIS — R002 Palpitations: Secondary | ICD-10-CM

## 2024-06-08 DIAGNOSIS — R072 Precordial pain: Secondary | ICD-10-CM | POA: Diagnosis not present

## 2024-06-08 NOTE — Patient Instructions (Addendum)
 Medication Instructions:  Continue same medications *If you need a refill on your cardiac medications before your next appointment, please call your pharmacy*  Lab Work: None ordered  Testing/Procedures: Exercise Treadmill  ( POET ) No food 4 hours before  You may have water No caffeine morning of  Wear comfortable clothes and walking shoes No lotion No cologne    Coronary Calcium Score    Follow-Up: At [facility], you and your health needs are our priority.  As part of our continuing mission to provide you with exceptional heart care, our providers are all part of one team.  This team includes your primary Cardiologist (physician) and Advanced Practice Providers or APPs (Physician Assistants and Nurse Practitioners) who all work together to provide you with the care you need, when you need it.  Your next appointment:  1 year    Call in Oct to schedule Jan appointment     Provider:  Dr.Nishan     We recommend signing up for the patient portal called MyChart.  Sign up information is provided on this After Visit Summary.  MyChart is used to connect with patients for Virtual Visits (Telemedicine).  Patients are able to view lab/test results, encounter notes, upcoming appointments, etc.  Non-urgent messages can be sent to your provider as well.   To learn more about what you can do with MyChart, go to [email or web address].   Other Instructions

## 2024-06-09 ENCOUNTER — Telehealth (HOSPITAL_COMMUNITY): Payer: Self-pay

## 2024-06-09 NOTE — Telephone Encounter (Signed)
 Spoke with the patient, detailed instructions given. S.Sueo Cullen CCT

## 2024-06-15 ENCOUNTER — Ambulatory Visit (HOSPITAL_COMMUNITY)
Admission: RE | Admit: 2024-06-15 | Discharge: 2024-06-15 | Disposition: A | Payer: Self-pay | Source: Ambulatory Visit | Attending: Cardiovascular Disease | Admitting: Cardiovascular Disease

## 2024-06-15 ENCOUNTER — Ambulatory Visit (HOSPITAL_COMMUNITY)
Admission: RE | Admit: 2024-06-15 | Discharge: 2024-06-15 | Disposition: A | Source: Ambulatory Visit | Attending: Cardiovascular Disease | Admitting: Cardiovascular Disease

## 2024-06-15 DIAGNOSIS — R002 Palpitations: Secondary | ICD-10-CM

## 2024-06-15 DIAGNOSIS — R072 Precordial pain: Secondary | ICD-10-CM

## 2024-06-15 LAB — EXERCISE TOLERANCE TEST
Angina Index: 0
Duke Treadmill Score: 9
Estimated workload: 10.1
Exercise duration (min): 9 min
Exercise duration (sec): 0 s
MPHR: 146 {beats}/min
Peak HR: 131 {beats}/min
Percent HR: 90 %
Rest HR: 66 {beats}/min
ST Depression (mm): 0.5 mm

## 2024-06-16 ENCOUNTER — Ambulatory Visit: Payer: Self-pay | Admitting: Cardiovascular Disease

## 2024-06-16 DIAGNOSIS — Z79899 Other long term (current) drug therapy: Secondary | ICD-10-CM

## 2024-06-16 DIAGNOSIS — E7800 Pure hypercholesterolemia, unspecified: Secondary | ICD-10-CM

## 2024-06-16 MED ORDER — ROSUVASTATIN CALCIUM 5 MG PO TABS: 5.0000 mg | ORAL_TABLET | Freq: Every day | ORAL | 3 refills | Status: AC
